# Patient Record
Sex: Female | Born: 1970 | Race: White | Hispanic: No | Marital: Married | Smoking: Never smoker
Health system: Southern US, Community
[De-identification: ages and names within clinical notes are randomized; demographics above are authoritative.]

## PROBLEM LIST (undated history)

## (undated) DIAGNOSIS — E785 Hyperlipidemia, unspecified: Secondary | ICD-10-CM

## (undated) DIAGNOSIS — K219 Gastro-esophageal reflux disease without esophagitis: Secondary | ICD-10-CM

## (undated) DIAGNOSIS — F419 Anxiety disorder, unspecified: Secondary | ICD-10-CM

## (undated) DIAGNOSIS — R011 Cardiac murmur, unspecified: Secondary | ICD-10-CM

## (undated) DIAGNOSIS — D649 Anemia, unspecified: Secondary | ICD-10-CM

## (undated) DIAGNOSIS — R7309 Other abnormal glucose: Secondary | ICD-10-CM

## (undated) HISTORY — DX: Gastro-esophageal reflux disease without esophagitis: K21.9

## (undated) HISTORY — DX: Other abnormal glucose: R73.09

## (undated) HISTORY — PX: HYSTEROSCOPY WITH D & C: SHX1775

## (undated) HISTORY — PX: DILATION AND EVACUATION: SHX1459

## (undated) HISTORY — DX: Hyperlipidemia, unspecified: E78.5

## (undated) HISTORY — DX: Cardiac murmur, unspecified: R01.1

## (undated) HISTORY — DX: Anemia, unspecified: D64.9

## (undated) HISTORY — DX: Anxiety disorder, unspecified: F41.9

---

## 2002-08-27 HISTORY — PX: BREAST BIOPSY: SHX20

## 2016-10-01 ENCOUNTER — Encounter: Payer: Self-pay | Admitting: Family Medicine

## 2016-10-01 ENCOUNTER — Ambulatory Visit (INDEPENDENT_AMBULATORY_CARE_PROVIDER_SITE_OTHER): Payer: 59 | Admitting: Family Medicine

## 2016-10-01 VITALS — BP 134/81 | HR 83 | Temp 98.6°F | Resp 16 | Ht 65.0 in | Wt 140.0 lb

## 2016-10-01 DIAGNOSIS — Z0000 Encounter for general adult medical examination without abnormal findings: Secondary | ICD-10-CM | POA: Diagnosis not present

## 2016-10-01 DIAGNOSIS — Z130 Encounter for screening for diseases of the blood and blood-forming organs and certain disorders involving the immune mechanism: Secondary | ICD-10-CM

## 2016-10-01 DIAGNOSIS — Z1329 Encounter for screening for other suspected endocrine disorder: Secondary | ICD-10-CM | POA: Diagnosis not present

## 2016-10-01 DIAGNOSIS — Z23 Encounter for immunization: Secondary | ICD-10-CM | POA: Diagnosis not present

## 2016-10-01 DIAGNOSIS — E785 Hyperlipidemia, unspecified: Secondary | ICD-10-CM

## 2016-10-01 DIAGNOSIS — Z1231 Encounter for screening mammogram for malignant neoplasm of breast: Secondary | ICD-10-CM | POA: Diagnosis not present

## 2016-10-01 DIAGNOSIS — R7309 Other abnormal glucose: Secondary | ICD-10-CM

## 2016-10-01 NOTE — Progress Notes (Signed)
Patient ID: Heather Shah, female  DOB: [birth year], 46 y.o.   MRN: [id number] Patient Care Team    Relationship Specialty Notifications Start End  Natalia Leatherwood, DO PCP - General Family Medicine  10/01/16   Romualdo Bolk, MD Consulting Physician Obstetrics and Gynecology  10/01/16     Subjective:  Heather Shah is a 46 y.o.  female present for new patient establishment/CPE. All past medical history, surgical history, allergies, family history, immunizations, medications and social history were obtained and entered  in the electronic medical record today. All recent labs, ED visits and hospitalizations within the last year were reviewed.  Prediabetic:Pt reports she had an a1c in May 2017 that was elevated to 5.8 (in [place]). At the time she was under a great deal of stress, house hunting and getting ready to relocate. She reports a healthy diet, but she has not been exercising as much as she would like.    Menorrhagia: Pt states she had an abnormal PAP within the last year and is due for repeat PAP. She also endorses heavier menstrual cycle, mild nausea with cycle and low back pain, increase in bloating. She is establishing with Dr. Dorita Sciara (GYN) in 1 week.   Health maintenance:  Colonoscopy: No fhx, screen at 48.  Mammogram: Last mammogram a few years ago, fibro-density of breast. She had to have a breast biopsy 2004, that was benign per her reports. She has a Mat sunt with breast cancer in 82s. Mammogram ordered at breast center.  Cervical cancer screening: last pap: 02/2016, results: abnormal cells, completed by: in [place] Immunizations: tdap admin today, Influenza declined (encouraged yearly) Infectious disease screening: HIV cpmpleted Assistive device: none Oxygen WUJ:WJXB Patient has a Dental home. Hospitalizations/ED visits: None  Immunization History  Administered Date(s) Administered  . Tdap 10/01/2016     Past Medical History:  Diagnosis Date  . Anemia   . Anxiety    . Elevated hemoglobin A1c   . Heart murmur   . Hyperlipidemia    Allergies  Allergen Reactions  . Bactrim [Sulfamethoxazole-Trimethoprim] Anaphylaxis    hallucinating  . Penicillins     sneezing   Past Surgical History:  Procedure Laterality Date  . BREAST BIOPSY  2004  . CESAREAN SECTION  2007  . DILATION AND EVACUATION  2010, 2012   x 2   Family History  Problem Relation Age of Onset  . Diabetes Mother   . Hypertension Mother   . Heart disease Mother   . Diabetes Father   . Alcohol abuse Father   . Hypertension Father   . Hyperlipidemia Father   . Heart disease Father   . Breast cancer Maternal Aunt 91   Social History   Social History  . Marital status: Married    Spouse name: [name]  . Number of children: 1  . Years of education: 4   Occupational History  . retirement Research scientist (medical)     Social History Main Topics  . Smoking status: Never Smoker  . Smokeless tobacco: Never Used  . Alcohol use 3.6 oz/week    6 Glasses of wine per week  . Drug use: No  . Sexual activity: Yes    Partners: Male     Comment: Married   Other Topics Concern  . Not on file   Social History Narrative   Married to "[name]". One child.    BA, Designer, fashion/clothing.    Moved from [facility] 02/2016.    Drinks caffeine. Uses herbal remedies.  Wears her seatbelt. Smoke detector in the home.    Firearms locked in the home.    Feels safe in her realtionships.    Allergies as of 10/01/2016      Reactions   Bactrim [sulfamethoxazole-trimethoprim] Anaphylaxis   hallucinating   Penicillins    sneezing      Medication List       Accurate as of 10/01/16 12:47 PM. Always use your most recent med list.          BIOTIN FORTE PO Take by mouth.   BLACK COHOSH PO Take by mouth.   IRON 27 PO Take by mouth.   Vitamin D (Cholecalciferol) 1000 units Caps Take by mouth.        No results found for this or any previous visit (from the past 2160 hour(s)).  Patient was never  admitted.   ROS: 14 pt review of systems performed and negative (unless mentioned in an HPI)  Objective: BP 134/81 (BP Location: Left Arm, Patient Position: Sitting, Cuff Size: Normal)   Pulse 83   Temp 98.6 F (37 C) (Oral)   Resp 16   Ht 5\' 5"  (1.651 m)   Wt 140 lb (63.5 kg)   LMP 09/28/2016 (Exact Date)   SpO2 99%   BMI 23.30 kg/m  Gen: Afebrile. No acute distress. Nontoxic in appearance, well-developed, well-nourished,  pleasant caucasian female.  HENT: AT. [place]. Bilateral TM visualized and normal in appearance, normal external auditory canal. MMM, no oral lesions, adequate dentition. Bilateral nares within normal limits. Throat without erythema, ulcerations or exudates. no Cough on exam, no hoarseness on exam. Eyes:Pupils Equal Round Reactive to light, Extraocular movements intact,  Conjunctiva without redness, discharge or icterus. Neck/lymp/endocrine: Supple,no lymphadenopathy, no thyromegaly CV: RRR 1/6 SM, no edema, +2/4 P posterior tibialis pulses. No  carotid bruits. No JVD. Chest: CTAB, no wheeze, rhonchi or crackles. normal Respiratory effort. good Air movement. Abd: Soft. flat. NTND. BS present. no Masses palpated. No hepatosplenomegaly. No rebound tenderness or guarding. Skin: no rashes, purpura or petechiae. Warm and well-perfused. Skin intact. Neuro/Msk:  Normal gait. PERLA. EOMi. Alert. Oriented x3.  Cranial nerves II through XII intact. Muscle strength 5/5 upper/lower extremity. DTRs equal bilaterally. Psych: Normal affect, dress and demeanor. Normal speech. Normal thought content and judgment.  Assessment/plan: Heather Shah is a 46 y.o. female present for establishment of care with CPE.  Encounter for preventive health examination Patient was encouraged to exercise greater than 150 minutes a week. Patient was encouraged to choose a diet filled with fresh fruits and vegetables, and lean meats. AVS provided to patient today for education/recommendation on gender  specific health and safety maintenance. Colonoscopy: No fhx, screen at 50.  Mammogram: Last mammogram a few years ago, fibro-density of breast. She had to have a breast biopsy 2004, that was benign per her reports. She has a Mat sunt with breast cancer in 3660s. Mammogram ordered at breast center.  Cervical cancer screening: last pap: 02/2016, results: abnormal cells, completed by: in [place] Immunizations: tdap admin today, Influenza declined (encouraged yearly) Infectious disease screening: HIV cpmpleted Assistive device: none Oxygen BJY:NWGNuse:none Patient has a Dental home. Hospitalizations/ED visits: None  Encounter for screening mammogram for breast cancer - MM Digital Diagnostic Bilat; Future Screening for deficiency anemia - CBC w/Diff; Future Thyroid disorder screen - Lipid panel; Future - TSH; Future Hyperlipidemia, unspecified hyperlipidemia type - Hemoglobin A1c; Future - Lipid panel; Future Elevated hemoglobin A1c - COMPLETE METABOLIC PANEL WITH GFR; Future - Hemoglobin A1c; Future  Return in about 1 year (around 10/01/2017) for CPE.  Electronically signed by: Felix Pacini, DO [facility] [place]

## 2016-10-01 NOTE — Patient Instructions (Signed)
Health Maintenance, Female Introduction Adopting a healthy lifestyle and getting preventive care can go a long way to promote health and wellness. Talk with your health care provider about what schedule of regular examinations is right for you. This is a good chance for you to check in with your provider about disease prevention and staying healthy. In between checkups, there are plenty of things you can do on your own. Experts have done a lot of research about which lifestyle changes and preventive measures are most likely to keep you healthy. Ask your health care provider for more information. Weight and diet Eat a healthy diet  Be sure to include plenty of vegetables, fruits, low-fat dairy products, and lean protein.  Do not eat a lot of foods high in solid fats, added sugars, or salt.  Get regular exercise. This is one of the most important things you can do for your health.  Most adults should exercise for at least 150 minutes each week. The exercise should increase your heart rate and make you sweat (moderate-intensity exercise).  Most adults should also do strengthening exercises at least twice a week. This is in addition to the moderate-intensity exercise. Maintain a healthy weight  Body mass index (BMI) is a measurement that can be used to identify possible weight problems. It estimates body fat based on height and weight. Your health care provider can help determine your BMI and help you achieve or maintain a healthy weight.  For females 41 years of age and older:  A BMI below 18.5 is considered underweight.  A BMI of 18.5 to 24.9 is normal.  A BMI of 25 to 29.9 is considered overweight.  A BMI of 30 and above is considered obese. Watch levels of cholesterol and blood lipids  You should start having your blood tested for lipids and cholesterol at 48 years of age, then have this test every 5 years.  You may need to have your cholesterol levels checked more often if:  Your  lipid or cholesterol levels are high.  You are older than 48 years of age.  You are at high risk for heart disease. Cancer screening Lung Cancer  Lung cancer screening is recommended for adults 66-37 years old who are at high risk for lung cancer because of a history of smoking.  A yearly low-dose CT scan of the lungs is recommended for people who:  Currently smoke.  Have quit within the past 15 years.  Have at least a 30-pack-year history of smoking. A pack year is smoking an average of one pack of cigarettes a day for 1 year.  Yearly screening should continue until it has been 15 years since you quit.  Yearly screening should stop if you develop a health problem that would prevent you from having lung cancer treatment. Breast Cancer  Practice breast self-awareness. This means understanding how your breasts normally appear and feel.  It also means doing regular breast self-exams. Let your health care provider know about any changes, no matter how small.  If you are in your 20s or 30s, you should have a clinical breast exam (CBE) by a health care provider every 1-3 years as part of a regular health exam.  If you are 45 or older, have a CBE every year. Also consider having a breast X-ray (mammogram) every year.  If you have a family history of breast cancer, talk to your health care provider about genetic screening.  If you are at high risk for breast cancer,  talk to your health care provider about having an MRI and a mammogram every year.  Breast cancer gene (BRCA) assessment is recommended for women who have family members with BRCA-related cancers. BRCA-related cancers include:  Breast.  Ovarian.  Tubal.  Peritoneal cancers.  Results of the assessment will determine the need for genetic counseling and BRCA1 and BRCA2 testing. Cervical Cancer  Your health care provider may recommend that you be screened regularly for cancer of the pelvic organs (ovaries, uterus, and  vagina). This screening involves a pelvic examination, including checking for microscopic changes to the surface of your cervix (Pap test). You may be encouraged to have this screening done every 3 years, beginning at age 21.  For women ages 30-65, health care providers may recommend pelvic exams and Pap testing every 3 years, or they may recommend the Pap and pelvic exam, combined with testing for human papilloma virus (HPV), every 5 years. Some types of HPV increase your risk of cervical cancer. Testing for HPV may also be done on women of any age with unclear Pap test results.  Other health care providers may not recommend any screening for nonpregnant women who are considered low risk for pelvic cancer and who do not have symptoms. Ask your health care provider if a screening pelvic exam is right for you.  If you have had past treatment for cervical cancer or a condition that could lead to cancer, you need Pap tests and screening for cancer for at least 20 years after your treatment. If Pap tests have been discontinued, your risk factors (such as having a new sexual partner) need to be reassessed to determine if screening should resume. Some women have medical problems that increase the chance of getting cervical cancer. In these cases, your health care provider may recommend more frequent screening and Pap tests. Colorectal Cancer  This type of cancer can be detected and often prevented.  Routine colorectal cancer screening usually begins at 48 years of age and continues through 48 years of age.  Your health care provider may recommend screening at an earlier age if you have risk factors for colon cancer.  Your health care provider may also recommend using home test kits to check for hidden blood in the stool.  A small camera at the end of a tube can be used to examine your colon directly (sigmoidoscopy or colonoscopy). This is done to check for the earliest forms of colorectal  cancer.  Routine screening usually begins at age 50.  Direct examination of the colon should be repeated every 5-10 years through 48 years of age. However, you may need to be screened more often if early forms of precancerous polyps or small growths are found. Skin Cancer  Check your skin from head to toe regularly.  Tell your health care provider about any new moles or changes in moles, especially if there is a change in a mole's shape or color.  Also tell your health care provider if you have a mole that is larger than the size of a pencil eraser.  Always use sunscreen. Apply sunscreen liberally and repeatedly throughout the day.  Protect yourself by wearing long sleeves, pants, a wide-brimmed hat, and sunglasses whenever you are outside. Heart disease, diabetes, and high blood pressure  High blood pressure causes heart disease and increases the risk of stroke. High blood pressure is more likely to develop in:  People who have blood pressure in the high end of the normal range (130-139/85-89 mm Hg).    People who are overweight or obese.  People who are African American.  If you are 18-39 years of age, have your blood pressure checked every 3-5 years. If you are 40 years of age or older, have your blood pressure checked every year. You should have your blood pressure measured twice-once when you are at a hospital or clinic, and once when you are not at a hospital or clinic. Record the average of the two measurements. To check your blood pressure when you are not at a hospital or clinic, you can use:  An automated blood pressure machine at a pharmacy.  A home blood pressure monitor.  If you are between 55 years and 79 years old, ask your health care provider if you should take aspirin to prevent strokes.  Have regular diabetes screenings. This involves taking a blood sample to check your fasting blood sugar level.  If you are at a normal weight and have a low risk for diabetes,  have this test once every three years after 48 years of age.  If you are overweight and have a high risk for diabetes, consider being tested at a younger age or more often. Preventing infection Hepatitis B  If you have a higher risk for hepatitis B, you should be screened for this virus. You are considered at high risk for hepatitis B if:  You were born in a country where hepatitis B is common. Ask your health care provider which countries are considered high risk.  Your parents were born in a high-risk country, and you have not been immunized against hepatitis B (hepatitis B vaccine).  You have HIV or AIDS.  You use needles to inject street drugs.  You live with someone who has hepatitis B.  You have had sex with someone who has hepatitis B.  You get hemodialysis treatment.  You take certain medicines for conditions, including cancer, organ transplantation, and autoimmune conditions. Hepatitis C  Blood testing is recommended for:  Everyone born from 1945 through 1965.  Anyone with known risk factors for hepatitis C. Sexually transmitted infections (STIs)  You should be screened for sexually transmitted infections (STIs) including gonorrhea and chlamydia if:  You are sexually active and are younger than 48 years of age.  You are older than 48 years of age and your health care provider tells you that you are at risk for this type of infection.  Your sexual activity has changed since you were last screened and you are at an increased risk for chlamydia or gonorrhea. Ask your health care provider if you are at risk.  If you do not have HIV, but are at risk, it may be recommended that you take a prescription medicine daily to prevent HIV infection. This is called pre-exposure prophylaxis (PrEP). You are considered at risk if:  You are sexually active and do not regularly use condoms or know the HIV status of your partner(s).  You take drugs by injection.  You are sexually  active with a partner who has HIV. Talk with your health care provider about whether you are at high risk of being infected with HIV. If you choose to begin PrEP, you should first be tested for HIV. You should then be tested every 3 months for as long as you are taking PrEP. Pregnancy  If you are premenopausal and you may become pregnant, ask your health care provider about preconception counseling.  If you may become pregnant, take 400 to 800 micrograms (mcg) of folic acid   every day.  If you want to prevent pregnancy, talk to your health care provider about birth control (contraception). Osteoporosis and menopause  Osteoporosis is a disease in which the bones lose minerals and strength with aging. This can result in serious bone fractures. Your risk for osteoporosis can be identified using a bone density scan.  If you are 65 years of age or older, or if you are at risk for osteoporosis and fractures, ask your health care provider if you should be screened.  Ask your health care provider whether you should take a calcium or vitamin D supplement to lower your risk for osteoporosis.  Menopause may have certain physical symptoms and risks.  Hormone replacement therapy may reduce some of these symptoms and risks. Talk to your health care provider about whether hormone replacement therapy is right for you. Follow these instructions at home:  Schedule regular health, dental, and eye exams.  Stay current with your immunizations.  Do not use any tobacco products including cigarettes, chewing tobacco, or electronic cigarettes.  If you are pregnant, do not drink alcohol.  If you are breastfeeding, limit how much and how often you drink alcohol.  Limit alcohol intake to no more than 1 drink per day for nonpregnant women. One drink equals 12 ounces of beer, 5 ounces of wine, or 1 ounces of hard liquor.  Do not use street drugs.  Do not share needles.  Ask your health care provider for  help if you need support or information about quitting drugs.  Tell your health care provider if you often feel depressed.  Tell your health care provider if you have ever been abused or do not feel safe at home. This information is not intended to replace advice given to you by your health care provider. Make sure you discuss any questions you have with your health care provider. Document Released: 02/26/2011 Document Revised: 01/19/2016 Document Reviewed: 05/17/2015  2017 [employer]  Please help us help you:  We are honored you have chosen [facility] for your Primary Care home. Below you will find basic instructions that you may need to access in the future. Please help us help you by reading the instructions, which cover many of the frequent questions we experience.   Prescription refills and request:  -In order to allow more efficient response time, please call your pharmacy for all refills. They will forward the request electronically to us. This allows for the quickest possible response. Request left on a nurse line can take longer to refill, since these are checked as time allows between office patients and other phone calls.  - refill request can take up to 3-5 working days to complete.  - If request is sent electronically and request is appropiate, it is usually completed in 1-2 business days.  - all patients will need to be seen routinely for all chronic medical conditions requiring prescription medications (see follow-up below). If you are overdue for follow up on your condition, you will be asked to make an appointment and we will call in enough medication to cover you until your appointment (up to 30 days).  - all controlled substances will require a face to face visit to request/refill.  - if you desire your prescriptions to go through a new pharmacy, and have an active script at original pharmacy, you will need to call your pharmacy and have scripts transferred to new pharmacy.  This is completed between the pharmacy locations and not by your provider.      Results: If any images or labs were ordered, it can take up to 1 week to get results depending on the test ordered and the lab/facility running and resulting the test. - Normal or stable results, which do not need further discussion, will be released to your mychart immediately with attached note to you. A call will not be generated for normal results. Please make certain to sign up for mychart. If you have questions on how to activate your mychart you can call the front office.  - If your results need further discussion, our office will attempt to contact you via phone, and if unable to reach you after 2 attempts, we will release your abnormal result to your mychart with instructions.  - All results will be automatically released in mychart after 1 week.  - Your provider will provide you with explanation and instruction on all relevant material in your results. Please keep in mind, results and labs may appear confusing or abnormal to the untrained eye, but it does not mean they are actually abnormal for you personally. If you have any questions about your results that are not covered, or you desire more detailed explanation than what was provided, you should make an appointment with your provider to do so.   Our office handles many outgoing and incoming calls daily. If we have not contacted you within 1 week about your results, please check your mychart to see if there is a message first and if not, then contact our office.  In helping with this matter, you help decrease call volume, and therefore allow [place] to be able to respond to patients needs more efficiently.   Acute office visits (sick visit):  An acute visit is intended for a new problem and are scheduled in shorter time slots to allow schedule openings for patients with new problems. This is the appropriate visit to discuss a new problem. In order to provide you with  excellent quality medical care with proper time for you to explain your problem, have an exam and receive treatment with instructions, these appointments should be limited to one new problem per visit. If you experience a new problem, in which you desire to be addressed, please make an acute office visit, we save openings on the schedule to accommodate you. Please do not save your new problem for any other type of visit, let us take care of it properly and quickly for you.   Follow up visits:  Depending on your condition(s) your provider will need to see you routinely in order to provide you with quality care and prescribe medication(s). Most chronic conditions (Example: hypertension, Diabetes, depression/anxiety... etc), require visits a couple times a year. Your provider will instruct you on proper follow up for your personal medical conditions and history. Please make certain to make follow up appointments for your condition as instructed. Failing to do so could result in lapse in your medication treatment/refills. If you request a refill, and are overdue to be seen on a condition, we will always provide you with a 30 day script (once) to allow you time to schedule.    Medicare wellness (well visit): - we have a wonderful Nurse Maudie Mercury), that will meet with you and provide you will yearly medicare wellness visits. These visits should occur yearly (can not be scheduled less than 1 calendar year apart) and cover preventive health, immunizations, advance directives and screenings you are entitled to yearly through your medicare benefits. Do not miss out on your entitled  benefits, this is when medicare will pay for these benefits to be ordered for you.  These are strongly encouraged by your provider and is the appropriate type of visit to make certain you are up to date with all preventive health benefits. If you have not had your medicare wellness exam in the last 12 months, please make certain to schedule one  by calling the office and schedule your medicare wellness with Kim as soon as possible.   Yearly physical (well visit):  - Adults are recommended to be seen yearly for physicals. Check with your insurance and date of your last physical, most insurances require one calendar year between physicals. Physicals include all preventive health topics, screenings, medical exam and labs that are appropriate for gender/age and history. You may have fasting labs needed at this visit. This is a well visit (not a sick visit), acute topics should not be covered during this visit.  - Pediatric patients are seen more frequently when they are younger. Your provider will advise you on well child visit timing that is appropriate for your their age. - This is not a medicare wellness visit. Medicare wellness exams do not have an exam portion to the visit. Some medicare companies allow for a physical, some do not allow a yearly physical. If your medicare allows a yearly physical you can schedule the medicare wellness with our nurse Kim and have your physical with your provider after, on the same day. Please check with insurance for your full benefits.   Late Policy/No Shows:  - all new patients should arrive 15-30 minutes earlier than appointment to allow us time  to  obtain all personal demographics,  insurance information and for you to complete office paperwork. - All established patients should arrive 10-15 minutes earlier than appointment time to update all information and be checked in .  - In our best efforts to run on time, if you are late for your appointment you will be asked to either reschedule or if able, we will work you back into the schedule. There will be a wait time to work you back in the schedule,  depending on availability.  - If you are unable to make it to your appointment as scheduled, please call 24 hours ahead of time to allow us to fill the time slot with someone else who needs to be seen. If you do  not cancel your appointment ahead of time, you may be charged a no show fee.   

## 2016-10-02 ENCOUNTER — Other Ambulatory Visit: Payer: 59

## 2016-10-04 ENCOUNTER — Telehealth: Payer: Self-pay | Admitting: Family Medicine

## 2016-10-04 NOTE — Telephone Encounter (Signed)
LM for patient to sign record release for mammogram disk if she has had one in the past so that [facility] can use it for comparison. She has a lab appointment Monday here in our office.

## 2016-10-08 ENCOUNTER — Telehealth: Payer: Self-pay | Admitting: Family Medicine

## 2016-10-08 ENCOUNTER — Other Ambulatory Visit (INDEPENDENT_AMBULATORY_CARE_PROVIDER_SITE_OTHER): Payer: 59

## 2016-10-08 DIAGNOSIS — E785 Hyperlipidemia, unspecified: Secondary | ICD-10-CM

## 2016-10-08 DIAGNOSIS — Z1329 Encounter for screening for other suspected endocrine disorder: Secondary | ICD-10-CM

## 2016-10-08 DIAGNOSIS — R7309 Other abnormal glucose: Secondary | ICD-10-CM

## 2016-10-08 DIAGNOSIS — Z130 Encounter for screening for diseases of the blood and blood-forming organs and certain disorders involving the immune mechanism: Secondary | ICD-10-CM | POA: Diagnosis not present

## 2016-10-08 LAB — LIPID PANEL
CHOL/HDL RATIO: 4.6 ratio (ref <5.0)
CHOLESTEROL: 148 mg/dL (ref <200)
HDL: 32 mg/dL — AB (ref >50)
LDL Cholesterol: 104 mg/dL — ABNORMAL HIGH (ref <100)
TRIGLYCERIDES: 62 mg/dL (ref <150)
VLDL: 12 mg/dL (ref <30)

## 2016-10-08 LAB — HEMOGLOBIN A1C
Hgb A1c MFr Bld: 5.1 % (ref <5.7)
Mean Plasma Glucose: 100 mg/dL

## 2016-10-08 LAB — COMPLETE METABOLIC PANEL WITH GFR
ALK PHOS: 48 U/L (ref 33–115)
ALT: 17 U/L (ref 6–29)
AST: 16 U/L (ref 10–35)
Albumin: 4 g/dL (ref 3.6–5.1)
BUN: 8 mg/dL (ref 7–25)
CHLORIDE: 105 mmol/L (ref 98–110)
CO2: 27 mmol/L (ref 20–31)
Calcium: 9.2 mg/dL (ref 8.6–10.2)
Creat: 0.83 mg/dL (ref 0.50–1.10)
GFR, EST NON AFRICAN AMERICAN: 84 mL/min (ref >=60)
GFR, Est African American: >89 mL/min (ref >=60)
GLUCOSE: 93 mg/dL (ref 65–99)
POTASSIUM: 4.7 mmol/L (ref 3.5–5.3)
SODIUM: 139 mmol/L (ref 135–146)
Total Bilirubin: 0.2 mg/dL (ref 0.2–1.2)
Total Protein: 6.5 g/dL (ref 6.1–8.1)

## 2016-10-08 LAB — TSH: TSH: 2.63 mIU/L

## 2016-10-08 NOTE — Telephone Encounter (Signed)
Opened in error

## 2016-10-08 NOTE — Telephone Encounter (Signed)
Patient has signed release for mammogram & I have faxed it.

## 2016-10-09 ENCOUNTER — Telehealth: Payer: Self-pay | Admitting: Family Medicine

## 2016-10-09 LAB — CBC WITH DIFFERENTIAL/PLATELET
BASOS ABS: 59 {cells}/uL (ref 0–200)
Basophils Relative: 1 %
EOS PCT: 2 %
Eosinophils Absolute: 118 cells/uL (ref 15–500)
HCT: 39.7 % (ref 35.0–45.0)
Hemoglobin: 12.8 g/dL (ref 11.7–15.5)
Lymphocytes Relative: 34 %
Lymphs Abs: 2006 cells/uL (ref 850–3900)
MCH: 29.9 pg (ref 27.0–33.0)
MCHC: 32.2 g/dL (ref 32.0–36.0)
MCV: 92.8 fL (ref 80.0–100.0)
MONOS PCT: 9 %
MPV: 9.7 fL (ref 7.5–12.5)
Monocytes Absolute: 531 cells/uL (ref 200–950)
NEUTROS PCT: 54 %
Neutro Abs: 3186 cells/uL (ref 1500–7800)
PLATELETS: 357 10*3/uL (ref 140–400)
RBC: 4.28 MIL/uL (ref 3.80–5.10)
RDW: 13.9 % (ref 11.0–15.0)
WBC: 5.9 10*3/uL (ref 3.8–10.8)

## 2016-10-09 NOTE — Telephone Encounter (Signed)
Please call pt - all labs are stable/normal.

## 2016-10-09 NOTE — Telephone Encounter (Signed)
Detailed message left on voice mail. Okay per DPR. 

## 2016-10-10 ENCOUNTER — Ambulatory Visit (INDEPENDENT_AMBULATORY_CARE_PROVIDER_SITE_OTHER): Payer: 59 | Admitting: Obstetrics and Gynecology

## 2016-10-10 ENCOUNTER — Encounter: Payer: Self-pay | Admitting: Obstetrics and Gynecology

## 2016-10-10 VITALS — BP 110/70 | HR 76 | Resp 16 | Ht 65.0 in | Wt 137.0 lb

## 2016-10-10 DIAGNOSIS — Z87898 Personal history of other specified conditions: Secondary | ICD-10-CM

## 2016-10-10 DIAGNOSIS — N898 Other specified noninflammatory disorders of vagina: Secondary | ICD-10-CM

## 2016-10-10 DIAGNOSIS — N920 Excessive and frequent menstruation with regular cycle: Secondary | ICD-10-CM | POA: Diagnosis not present

## 2016-10-10 DIAGNOSIS — Z8742 Personal history of other diseases of the female genital tract: Secondary | ICD-10-CM

## 2016-10-10 NOTE — Patient Instructions (Signed)
Menorrhagia °Menorrhagia is the medical term for when your menstrual periods are heavy or last longer than usual. With menorrhagia, every period you have may cause enough blood loss and cramping that you are unable to maintain your usual activities. °CAUSES  °In some cases, the cause of heavy periods is unknown, but a number of conditions may cause menorrhagia. Common causes include: °· A problem with the hormone-producing thyroid gland (hypothyroid). °· Noncancerous growths in the uterus (polyps or fibroids). °· An imbalance of the estrogen and progesterone hormones. °· One of your ovaries not releasing an egg during one or more months. °· Side effects of having an intrauterine device (IUD). °· Side effects of some medicines, such as anti-inflammatory medicines or blood thinners. °· A bleeding disorder that stops your blood from clotting normally. °SIGNS AND SYMPTOMS  °During a normal period, bleeding lasts between 4 and 8 days. Signs that your periods are too heavy include: °· You routinely have to change your pad or tampon every 1 or 2 hours because it is completely soaked. °· You pass blood clots larger than 1 inch (2.5 cm) in size. °· You have bleeding for more than 7 days. °· You need to use pads and tampons at the same time because of heavy bleeding. °· You need to wake up to change your pads or tampons during the night. °· You have symptoms of anemia, such as tiredness, fatigue, or shortness of breath.  °DIAGNOSIS  °Your health care provider will perform a physical exam and ask you questions about your symptoms and menstrual history. Other tests may be ordered based on what the health care provider finds during the exam. These tests can include: °· Blood tests. Blood tests are used to check if you are pregnant or have hormonal changes, a bleeding or thyroid disorder, low iron levels (anemia), or other problems. °· Endometrial biopsy. Your health care provider takes a sample of tissue from the inside of your  uterus to be examined under a microscope. °· Pelvic ultrasound. This test uses sound waves to make a picture of your uterus, ovaries, and vagina. The pictures can show if you have fibroids or other growths. °· Hysteroscopy. For this test, your health care provider will use a small telescope to look inside your uterus. °Based on the results of your initial tests, your health care provider may recommend further testing. °TREATMENT  °Treatment may not be needed. If it is needed, your health care provider may recommend treatment with one or more medicines first. If these do not reduce bleeding enough, a surgical treatment might be an option. The best treatment for you will depend on:  °· Whether you need to prevent pregnancy.   °· Your desire to have children in the future. °· The cause and severity of your bleeding. °· Your opinion and personal preference.   °Medicines for menorrhagia may include: °· Birth control methods that use hormones. These include the pill, skin patch, vaginal ring, shots that you get every 3 months, hormonal IUD, and implant. These treatments reduce bleeding during your menstrual period. °· Medicines that thicken blood and slow bleeding. °· Medicines that reduce swelling, such as ibuprofen.  °· Medicines that contain a synthetic hormone called progestin.   °· Medicines that make the ovaries stop working for a short time.   °You may need surgical treatment for menorrhagia if the medicines are unsuccessful. Treatment options include: °· Dilation and curettage (D&C). In this procedure, your health care provider opens (dilates) your cervix and then scrapes or suctions tissue from   the lining of your uterus to reduce menstrual bleeding. °· Operative hysteroscopy. This procedure uses a tiny tube with a light (hysteroscope) to view your uterine cavity and can help in the surgical removal of a polyp that may be causing heavy periods. °· Endometrial ablation. Through various techniques, your health care  provider permanently destroys the entire lining of your uterus (endometrium). After endometrial ablation, most women have little or no menstrual flow. Endometrial ablation reduces your ability to become pregnant. °· Endometrial resection. This surgical procedure uses an electrosurgical wire loop to remove the lining of the uterus. This procedure also reduces your ability to become pregnant. °· Hysterectomy. Surgical removal of the uterus and cervix is a permanent procedure that stops menstrual periods. Pregnancy is not possible after a hysterectomy. This procedure requires anesthesia and hospitalization. °HOME CARE INSTRUCTIONS  °· Only take over-the-counter or prescription medicines as directed by your health care provider. Take prescribed medicines exactly as directed. Do not change or switch medicines without consulting your health care provider. °· Take any prescribed iron pills exactly as directed by your health care provider. Long-term heavy bleeding may result in low iron levels. Iron pills help replace the iron your body lost from heavy bleeding. Iron may cause constipation. If this becomes a problem, increase the bran, fruits, and roughage in your diet. °· Do not take aspirin or medicines that contain aspirin 1 week before or during your menstrual period. Aspirin may make the bleeding worse. °· If you need to change your sanitary pad or tampon more than once every 2 hours, stay in bed and rest as much as possible until the bleeding stops. °· Eat well-balanced meals. Eat foods high in iron. Examples are leafy green vegetables, meat, liver, eggs, and whole grain breads and cereals. Do not try to lose weight until the abnormal bleeding has stopped and your blood iron level is back to normal. °SEEK MEDICAL CARE IF:  °· You soak through a pad or tampon every 1 or 2 hours, and this happens every time you have a period. °· You need to use pads and tampons at the same time because you are bleeding so much. °· You  need to change your pad or tampon during the night. °· You have a period that lasts for more than 8 days. °· You pass clots bigger than 1 inch wide. °· You have irregular periods that happen more or less often than once a month. °· You feel dizzy or faint. °· You feel very weak or tired. °· You feel short of breath or feel your heart is beating too fast when you exercise. °· You have nausea and vomiting or diarrhea while you are taking your medicine. °· You have any problems that may be related to the medicine you are taking. °SEEK IMMEDIATE MEDICAL CARE IF:  °· You soak through 4 or more pads or tampons in 2 hours. °· You have any bleeding while you are pregnant. °MAKE SURE YOU:  °· Understand these instructions. °· Will watch your condition. °· Will get help right away if you are not doing well or get worse. °This information is not intended to replace advice given to you by your health care provider. Make sure you discuss any questions you have with your health care provider. °Document Released: 08/13/2005 Document Revised: 12/05/2015 Document Reviewed: 02/01/2013 °Elsevier Interactive Patient Education © 2017 Elsevier Inc. ° °

## 2016-10-10 NOTE — Progress Notes (Signed)
46 y.o. [id number] MarriedCaucasianF here for follow up of an abnormal pap and heavy bleeding.   She has a h/o endometrial polyps, S/P D&C in 10/16. Other than a thickened stripe, she had a normal gyn ultrasound in 7/16.  Menses q 28-30 days x 5-7 days. Saturates a pad in up to 30 minutes at times. Not all of her cycles are that heavy. Her bleeding got lighter after her surgery, then it got heavy again.  Her heavy cycles started in around 2013.  Her last cycle was so heavy, she didn't feel well. Her Hgb from 10/08/16 was normal.  No cramps.  Sexually active, using W/D for contraception. Doesn't want to get pregnant. She would like her husband to get a vasectomy. She was on OCP's in the past, stopped when she found a lump in her breast.  She is having mild perimenopausal symptoms. Mild vasomotor symptoms, mood changes. She had a LSIL pap in 6/17, had a satisfactory colposcopy with a negative ECC. She does c/o a vaginal odor, not sure if it is sweat or not. Going on for a while.     Patient's last menstrual period was 09/28/2016 (exact date).          Sexually active: Yes.    The current method of family planning is none.    Exercising: No.  The patient does not participate in regular exercise at present. Smoker:  no  Health Maintenance: Pap:  02/20/16-LGSIL, Mild Dysplasia  History of abnormal Pap:  Yes-03/07/16 Colpo-wnl, satisfactory, negative ECC  Prior to last year she had an abnormal pap with normal f/u.  MMG:  04/09/2012 BIRADS2,  Colonoscopy:  Never BMD:   Never TDaP:  10/01/16 Gardasil: Never   reports that she has never smoked. She has never used smokeless tobacco. She reports that she drinks about 3.6 oz of alcohol per week . She reports that she does not use drugs.She works in [employer] sales, travels for work.  She moved from [place], doing better here, much less stressful. Daughter is 10 almost 7111. Transitioning okay.   Past Medical History:  Diagnosis Date  . Anemia   .  Anxiety   . Elevated hemoglobin A1c   . Heart murmur   . Hyperlipidemia   Her HgbA1 C and lipids are better in the last 6 months. She feels it is due to her decreased stress level.  Past Surgical History:  Procedure Laterality Date  . BREAST BIOPSY  2004  . CESAREAN SECTION  2007  . DILATION AND EVACUATION  2010, 2012   x 2    Current Outpatient Prescriptions  Medication Sig Dispense Refill  . BIOTIN FORTE PO Take by mouth.    Marland Kitchen. BLACK COHOSH PO Take by mouth.    . Ferrous Gluconate (IRON 27 PO) Take by mouth.    . Saccharomyces boulardii (PROBIOTIC) 250 MG CAPS Take by mouth.    . Vitamin D, Cholecalciferol, 1000 units CAPS Take by mouth.     No current facility-administered medications for this visit.     Family History  Problem Relation Age of Onset  . Diabetes Mother   . Hypertension Mother   . Heart disease Mother   . Diabetes Father   . Alcohol abuse Father   . Hypertension Father   . Hyperlipidemia Father   . Heart disease Father   . Breast cancer Maternal Aunt [id number]  Father died in March 2017, tons of stress in the last year, better now.  Review of Systems  Constitutional: Negative.   HENT: Negative.   Eyes: Negative.   Respiratory: Negative.   Cardiovascular: Negative.   Gastrointestinal: Negative.   Endocrine: Negative.   Genitourinary:       Pt c/o of vaginal odor  Musculoskeletal: Negative.   Skin: Negative.   Allergic/Immunologic: Negative.   Neurological: Negative.   Hematological: Negative.   Psychiatric/Behavioral: Negative.     Exam:   BP 110/70 (BP Location: Right Arm, Patient Position: Sitting, Cuff Size: Normal)   Pulse 76   Resp 16   Ht 5\' 5"  (1.651 m)   Wt 137 lb (62.1 kg)   LMP 09/28/2016 (Exact Date)   BMI 22.80 kg/m   Weight change: @WEIGHTCHANGE @ Height:   Height: 5\' 5"  (165.1 cm)  Ht Readings from Last 3 Encounters:  10/10/16 5\' 5"  (1.651 m)  10/01/16 5\' 5"  (1.651 m)    General appearance: alert, cooperative and appears  stated age Head: Normocephalic, without obvious abnormality, atraumatic Neck: no adenopathy, supple, symmetrical, trachea midline and thyroid normal to inspection and palpation Abdomen: soft, non-tender; bowel sounds normal; no masses,  no organomegaly Extremities: extremities normal, atraumatic, no cyanosis or edema Skin: Skin color, texture, turgor normal. No rashes or lesions Lymph nodes: Cervical nodes normal.    Pelvic: External genitalia:  no lesions              Urethra:  normal appearing urethra with no masses, tenderness or lesions              Bartholins and Skenes: normal                 Vagina: normal appearing vagina with normal color and discharge, no lesions              Cervix: no lesions               Bimanual Exam:  Uterus:  normal size, contour, position, consistency, mobility, non-tender and anteverted              Adnexa: no mass, fullness, tenderness               Rectovaginal: Confirms               Anus:  normal sphincter tone, no lesions  Chaperone was present for exam.  A:  H/O menorrhagia, not anemia, h/o endometrial polyp in the past  H/O LSIL pap in 6/17, negative colposcopy in 7/17. I reviewed the guidelines for f/u with LSIL pap  Vaginal odor  P:   Return for an ultrasound, possible sonohysterogram  Discussed mirena IUD  Due for a pap in 6/18  Wet prep probe  30 minutes spent face to face with the patient, over 50% in counseling

## 2016-10-11 LAB — WET PREP BY MOLECULAR PROBE
Candida species: NEGATIVE
GARDNERELLA VAGINALIS: NEGATIVE
Trichomonas vaginosis: NEGATIVE

## 2016-10-15 ENCOUNTER — Telehealth: Payer: Self-pay | Admitting: Family Medicine

## 2016-10-15 NOTE — Telephone Encounter (Signed)
LM for patient that Mammogram disk is available for pu at the front desk.

## 2016-10-16 ENCOUNTER — Telehealth: Payer: Self-pay | Admitting: Obstetrics and Gynecology

## 2016-10-16 NOTE — Telephone Encounter (Signed)
Called patient to review benefits for a recommended procedure. Left Voicemail requesting a call back. °

## 2016-10-17 NOTE — Telephone Encounter (Signed)
Spoke with patient to provide benefit information regarding a sonohysterogram. Patient understood the information presented, however is not sure she wants to proceed with this test.  Patient is asking if an IUD would be an option to consider? Advised patient I will forward her question to her doctor. Patient is agreeable to a return call.   Routing to Dr Oscar LaJertson for review

## 2016-10-18 NOTE — Telephone Encounter (Signed)
Left message to call [employer] at [phone]. 

## 2016-10-18 NOTE — Telephone Encounter (Signed)
Spoke with patient, advised as seen below per Dr. Oscar LaJertson. Patient expressed frustration. Patient states she does not have $1200 to pay for something she just had done a year ago. Patient states polyps get removed and grow back. Patient states she would like to weigh her options and just wait and see what happens. Recommended OV to discuss options and concerns, patient declined. Patient states she has a AEX in 6 months, will discuss then. Advised patient would update Dr. Oscar LaJertson and return call with any additional recommendations, patient is agreeable.  Dr. Oscar LaJertson, routing FYI, review and advise.

## 2016-10-18 NOTE — Telephone Encounter (Signed)
I'm concerned she may have developed another polyp, or something else may be going on. I would recommend she be evaluated. I wouldn't want to place an IUD without knowing if she has a medical reason for the bleeding.  She could do a trial of OCP's if she wants for 3 months to see if that controls the bleeding. I would want her to have a mammogram first though.  Please discuss with her.

## 2016-10-22 NOTE — Telephone Encounter (Signed)
Spoke with patient, advised as seen below per Dr. Oscar LaJertson. Patient states menses d/t start in next couple of days, will know then. Patient will wait until next AEX to f/u, will call if bleeding is worse. Patient thankful for return call and verbalizes understanding.  Routing to provider for final review. Patient is agreeable to disposition. Will close encounter.

## 2016-10-22 NOTE — Telephone Encounter (Signed)
I understand her frustration. Using the mirena IUD could stop further growth of polyps, but you wouldn't want to put an IUD in if she has a polyp in now.  I agree with calendaring her cycles and f/u at her annual. Call if bleeding gets worse, or with any other concerns.

## 2016-11-06 ENCOUNTER — Ambulatory Visit (INDEPENDENT_AMBULATORY_CARE_PROVIDER_SITE_OTHER): Payer: 59 | Admitting: Family Medicine

## 2016-11-06 ENCOUNTER — Encounter: Payer: Self-pay | Admitting: Family Medicine

## 2016-11-06 VITALS — BP 112/61 | HR 83 | Temp 98.4°F | Resp 20 | Wt 137.5 lb

## 2016-11-06 DIAGNOSIS — J40 Bronchitis, not specified as acute or chronic: Secondary | ICD-10-CM

## 2016-11-06 MED ORDER — METRONIDAZOLE 500 MG PO TABS: 500.0000 mg | ORAL_TABLET | Freq: Two times a day (BID) | ORAL | 0 refills | Status: DC

## 2016-11-06 MED ORDER — HYDROCODONE-HOMATROPINE 5-1.5 MG/5ML PO SYRP: 5.0000 mL | ORAL_SOLUTION | Freq: Three times a day (TID) | ORAL | 0 refills | Status: DC | PRN

## 2016-11-06 MED ORDER — DOXYCYCLINE HYCLATE 100 MG PO TABS: 100.0000 mg | ORAL_TABLET | Freq: Two times a day (BID) | ORAL | 0 refills | Status: DC

## 2016-11-06 NOTE — Patient Instructions (Addendum)
Rest, hydrate.  Daily zyrtec, flonase. Mucinex DM Start doxycyline every 12 hours for 10 days.  Cough syrup for night time use.    Acute Bronchitis, Adult Acute bronchitis is when air tubes (bronchi) in the lungs suddenly get swollen. The condition can make it hard to breathe. It can also cause these symptoms:  A cough.  Coughing up clear, yellow, or green mucus.  Wheezing.  Chest congestion.  Shortness of breath.  A fever.  Body aches.  Chills.  A sore throat. Follow these instructions at home: Medicines   Take over-the-counter and prescription medicines only as told by your doctor.  If you were prescribed an antibiotic medicine, take it as told by your doctor. Do not stop taking the antibiotic even if you start to feel better. General instructions   Rest.  Drink enough fluids to keep your pee (urine) clear or pale yellow.  Avoid smoking and secondhand smoke. If you smoke and you need help quitting, ask your doctor. Quitting will help your lungs heal faster.  Use an inhaler, cool mist vaporizer, or humidifier as told by your doctor.  Keep all follow-up visits as told by your doctor. This is important. How is this prevented? To lower your risk of getting this condition again:  Wash your hands often with soap and water. If you cannot use soap and water, use hand sanitizer.  Avoid contact with people who have cold symptoms.  Try not to touch your hands to your mouth, nose, or eyes.  Make sure to get the flu shot every year. Contact a doctor if:  Your symptoms do not get better in 2 weeks. Get help right away if:  You cough up blood.  You have chest pain.  You have very bad shortness of breath.  You become dehydrated.  You faint (pass out) or keep feeling like you are going to pass out.  You keep throwing up (vomiting).  You have a very bad headache.  Your fever or chills gets worse. This information is not intended to replace advice given to you  by your health care provider. Make sure you discuss any questions you have with your health care provider. Document Released: 01/30/2008 Document Revised: 03/21/2016 Document Reviewed: 02/01/2016 Elsevier Interactive Patient Education  2017 [employer]

## 2016-11-06 NOTE — Progress Notes (Signed)
Heather Shah , 05-24-1971, 46 y.o., female MRN: [id number] Patient Care Team    Relationship Specialty Notifications Start End  Heather Leatherwoodenee A Kuneff, DO PCP - General Family Medicine  10/01/16   Heather BolkJill Evelyn Jertson, MD Consulting Physician Obstetrics and Gynecology  10/01/16     CC: URI Subjective: Pt presents for an OV with complaints of URI of 5 days  duration.  Associated symptoms include cough, rhinorrhea, headache, ear fullness and increased phlegm production. She tried to take tessalon perles, antihistamine and OTC sinus medications. Nothing is helping and she can not sleep. She states she kept her husband up last night with her coughing.   No flowsheet data found.  Allergies  Allergen Reactions  . Bactrim [Sulfamethoxazole-Trimethoprim] Anaphylaxis    hallucinating  . Penicillins     sneezing   Social History  Substance Use Topics  . Smoking status: Never Smoker  . Smokeless tobacco: Never Used  . Alcohol use 3.6 oz/week    6 Glasses of wine per week   Past Medical History:  Diagnosis Date  . Anemia   . Anxiety   . Elevated hemoglobin A1c   . Heart murmur   . Hyperlipidemia    Past Surgical History:  Procedure Laterality Date  . BREAST BIOPSY  2004  . CESAREAN SECTION  2007  . DILATION AND EVACUATION  2010, 2012   x 2  . HYSTEROSCOPY W/D&C     Family History  Problem Relation Age of Onset  . Diabetes Mother   . Hypertension Mother   . Heart disease Mother   . Diabetes Father   . Alcohol abuse Father   . Hypertension Father   . Hyperlipidemia Father   . Heart disease Father   . Breast cancer Maternal Aunt 62  . Diabetes Paternal Aunt    Allergies as of 11/06/2016      Reactions   Bactrim [sulfamethoxazole-trimethoprim] Anaphylaxis   hallucinating   Penicillins    sneezing      Medication List       Accurate as of 11/06/16  2:31 PM. Always use your most recent med list.          BIOTIN FORTE PO Take by mouth.   BLACK COHOSH PO Take by mouth.   IRON 27 PO Take by mouth.   Probiotic 250 MG Caps Take by mouth.   Vitamin D (Cholecalciferol) 1000 units Caps Take by mouth.       No results found for this or any previous visit (from the past 24 hour(s)). No results found.   ROS: Negative, with the exception of above mentioned in HPI   Objective:  BP 112/61 (BP Location: Right Arm, Patient Position: Sitting, Cuff Size: Normal)   Pulse 83   Temp 98.4 F (36.9 C)   Resp 20   Wt 137 lb 8 oz (62.4 kg)   SpO2 100%   BMI 22.88 kg/m  Body mass index is 22.88 kg/m. Gen: Afebrile. No acute distress. Nontoxic in appearance, well developed, well nourished.  HENT: AT. [place]. Bilateral TM visualized fulness. MMM, no oral lesions. Bilateral nares WNL. Throat without erythema or exudates. PND, hoarseness and cough present.  Eyes:Pupils Equal Round Reactive to light, Extraocular movements intact,  Conjunctiva without redness, discharge or icterus. Neck/lymp/endocrine: Supple,no lymphadenopathy CV: RRR  Chest: CTAB, no wheeze or crackles. Good air movement, normal resp effort.  Abd: Soft. NTND. BS present.  Neuro:  Normal gait. PERLA. EOMi. Alert. Oriented x3   Assessment/Plan: Heather Shah  is a 46 y.o. female present for  OV for  Bronchitis Rest, hydrate, mucinex DM. Doxycyline, hycodan.  F/U PRN   Reviewed expectations re: course of current medical issues.  Discussed self-management of symptoms.  Outlined signs and symptoms indicating need for more acute intervention.  Patient verbalized understanding and all questions were answered.  Patient received an After-Visit Summary.   electronically signed by:  Felix Pacini, DO  [facility] [place]

## 2016-11-09 ENCOUNTER — Telehealth: Payer: Self-pay | Admitting: Family Medicine

## 2016-11-09 NOTE — Telephone Encounter (Signed)
Patient does not think that doxycycline (VIBRA-TABS) 100 MG tablet is working. Patient also stated that her cycle has started again.   If there is another suggestion for medication please send it to  [id number] - [place], [place] - [phone] (Phone) [phone] (Fax)   Please call patient to advise. Okay to leave detailed message on patient phone.

## 2016-11-09 NOTE — Telephone Encounter (Signed)
Left message for patient she just started the antibiotic and needs to complete the course she can follow up if no improvement after completion. Recommend she follow up with her GYN regarding her cycle since they are seeing her for this problem.

## 2016-11-09 NOTE — Telephone Encounter (Signed)
Patient has picked up °

## 2016-11-27 ENCOUNTER — Other Ambulatory Visit: Payer: Self-pay | Admitting: Family Medicine

## 2016-11-27 DIAGNOSIS — Z1231 Encounter for screening mammogram for malignant neoplasm of breast: Secondary | ICD-10-CM

## 2016-12-21 ENCOUNTER — Ambulatory Visit
Admission: RE | Admit: 2016-12-21 | Discharge: 2016-12-21 | Disposition: A | Discharge status: Home / Self Care | Payer: 59 | Source: Ambulatory Visit | Attending: Family Medicine | Admitting: Family Medicine

## 2016-12-21 DIAGNOSIS — Z1231 Encounter for screening mammogram for malignant neoplasm of breast: Secondary | ICD-10-CM | POA: Diagnosis not present

## 2017-10-25 ENCOUNTER — Ambulatory Visit: Payer: 59 | Admitting: Obstetrics and Gynecology

## 2017-10-28 ENCOUNTER — Ambulatory Visit (INDEPENDENT_AMBULATORY_CARE_PROVIDER_SITE_OTHER): Payer: 59 | Admitting: Family Medicine

## 2017-10-28 ENCOUNTER — Encounter: Payer: Self-pay | Admitting: Family Medicine

## 2017-10-28 VITALS — BP 120/77 | HR 79 | Temp 98.4°F | Resp 20 | Ht 65.0 in | Wt 146.5 lb

## 2017-10-28 DIAGNOSIS — J0100 Acute maxillary sinusitis, unspecified: Secondary | ICD-10-CM | POA: Diagnosis not present

## 2017-10-28 MED ORDER — DOXYCYCLINE HYCLATE 100 MG PO TABS: 100.0000 mg | ORAL_TABLET | Freq: Two times a day (BID) | ORAL | 0 refills | Status: DC

## 2017-10-28 NOTE — Patient Instructions (Signed)
Rest, hydrate.  + flonase, mucinex (DM if cough), nettie pot or nasal saline.  Doxycyline (printed) prescribed, take until completed if started. Give your self another 24 hours to see if you continue to improve. If you get better, do not start antibiotic.  No afrin use after 3 days.  If cough present it can last up to 6-8 weeks.  F/U 2 weeks of not improved.    Sinusitis, Adult Sinusitis is soreness and inflammation of your sinuses. Sinuses are hollow spaces in the bones around your face. They are located:  Around your eyes.  In the middle of your forehead.  Behind your nose.  In your cheekbones.  Your sinuses and nasal passages are lined with a stringy fluid (mucus). Mucus normally drains out of your sinuses. When your nasal tissues get inflamed or swollen, the mucus can get trapped or blocked so air cannot flow through your sinuses. This lets bacteria, viruses, and funguses grow, and that leads to infection. Follow these instructions at home: Medicines  Take, use, or apply over-the-counter and prescription medicines only as told by your doctor. These may include nasal sprays.  If you were prescribed an antibiotic medicine, take it as told by your doctor. Do not stop taking the antibiotic even if you start to feel better. Hydrate and Humidify  Drink enough water to keep your pee (urine) clear or pale yellow.  Use a cool mist humidifier to keep the humidity level in your home above 50%.  Breathe in steam for 10-15 minutes, 3-4 times a day or as told by your doctor. You can do this in the bathroom while a hot shower is running.  Try not to spend time in cool or dry air. Rest  Rest as much as possible.  Sleep with your head raised (elevated).  Make sure to get enough sleep each night. General instructions  Put a warm, moist washcloth on your face 3-4 times a day or as told by your doctor. This will help with discomfort.  Wash your hands often with soap and water. If there  is no soap and water, use hand sanitizer.  Do not smoke. Avoid being around people who are smoking (secondhand smoke).  Keep all follow-up visits as told by your doctor. This is important. Contact a doctor if:  You have a fever.  Your symptoms get worse.  Your symptoms do not get better within 10 days. Get help right away if:  You have a very bad headache.  You cannot stop throwing up (vomiting).  You have pain or swelling around your face or eyes.  You have trouble seeing.  You feel confused.  Your neck is stiff.  You have trouble breathing. This information is not intended to replace advice given to you by your health care provider. Make sure you discuss any questions you have with your health care provider. Document Released: 01/30/2008 Document Revised: 04/08/2016 Document Reviewed: 06/08/2015 Elsevier Interactive Patient Education  [employer]2018 Else[employer] Inc[employer]

## 2017-10-28 NOTE — Progress Notes (Signed)
Heather Shah , May 31, 1971, 47 y.o., female MRN: [id number] Patient Care Team    Relationship Specialty Notifications Start End  Natalia LeatherwoodKuneff, Renee A, DO PCP - General Family Medicine  10/01/16   Romualdo BolkJertson, Jill Evelyn, MD Consulting Physician Obstetrics and Gynecology  10/01/16     Chief Complaint  Patient presents with  . URI    cough,congestion,drainage x 5 days     Subjective: Pt presents for an OV with complaints of productive cough of 5 days duration.  Associated symptoms include nasal and chest congestion, nasal drainage and nausea. Vomit x1 after coughing fit. She denies fever, chills, diarrhea or rash. Pt has tried mucinex, afrin and allergy med to ease their symptoms.   Depression screen PHQ 2/9 10/28/2017  Decreased Interest 0  Down, Depressed, Hopeless 0  PHQ - 2 Score 0    Allergies  Allergen Reactions  . Bactrim [Sulfamethoxazole-Trimethoprim] Anaphylaxis    hallucinating  . Penicillins     sneezing   Social History   Tobacco Use  . Smoking status: Never Smoker  . Smokeless tobacco: Never Used  Substance Use Topics  . Alcohol use: Yes    Alcohol/week: 3.6 oz    Types: 6 Glasses of wine per week   Past Medical History:  Diagnosis Date  . Anemia   . Anxiety   . Elevated hemoglobin A1c   . Heart murmur   . Hyperlipidemia    Past Surgical History:  Procedure Laterality Date  . BREAST BIOPSY  2004  . CESAREAN SECTION  2007  . DILATION AND EVACUATION  2010, 2012   x 2  . HYSTEROSCOPY W/D&C     Family History  Problem Relation Age of Onset  . Diabetes Mother   . Hypertension Mother   . Heart disease Mother   . Diabetes Father   . Alcohol abuse Father   . Hypertension Father   . Hyperlipidemia Father   . Heart disease Father   . Breast cancer Maternal Aunt 62  . Diabetes Paternal Aunt    Allergies as of 10/28/2017      Reactions   Bactrim [sulfamethoxazole-trimethoprim] Anaphylaxis   hallucinating   Penicillins    sneezing      Medication List        Accurate as of 10/28/17  9:32 AM. Always use your most recent med list.          BIOTIN FORTE PO Take by mouth.   BLACK COHOSH PO Take by mouth.   Probiotic 250 MG Caps Take by mouth.   Vitamin D (Cholecalciferol) 1000 units Caps Take by mouth.       All past medical history, surgical history, allergies, family history, immunizations andmedications were updated in the EMR today and reviewed under the history and medication portions of their EMR.     ROS: Negative, with the exception of above mentioned in HPI   Objective:  BP 120/77 (BP Location: Left Arm, Patient Position: Sitting, Cuff Size: Normal)   Pulse 79   Temp 98.4 F (36.9 C)   Resp 20   Ht 5\' 5"  (1.651 m)   Wt 146 lb 8 oz (66.5 kg)   LMP 10/17/2017   SpO2 96%   BMI 24.38 kg/m  Body mass index is 24.38 kg/m. Gen: Afebrile. No acute distress. Nontoxic in appearance, well developed, well nourished.  HENT: AT. [place]. Bilateral TM visualized right fullness, left retracted. MMM, no oral lesions. Bilateral nares with erythema and drainage. Throat without erythema or exudates.  Cough and hoarseness present.  Eyes:Pupils Equal Round Reactive to light, Extraocular movements intact,  Conjunctiva without redness, discharge or icterus. Neck/lymp/endocrine: Supple,bilateral ant cervical  lymphadenopathy CV: RRR Electronically  Chest: CTAB, no wheeze or crackles. Good air movement, normal resp effort.  Skin: no rashes, purpura or petechiae.  Neuro:  Normal gait. PERLA. EOMi. Alert. Oriented x3  No exam data present No results found. No results found for this or any previous visit (from the past 24 hour(s)).  Assessment/Plan: Heather Shah is a 47 y.o. female present for OV for  1. Acute maxillary sinusitis, recurrence not specified Rest, hydrate.  Stop afrin after 3 days use.  + flonase, mucinex (DM if cough), nettie pot or nasal saline.  Doxy start in 24 hours if does not improve prescribed, take until completed.   If cough present it can last up to 6-8 weeks.  F/U 2 weeks of not improved.     Reviewed expectations re: course of current medical issues.  Discussed self-management of symptoms.  Outlined signs and symptoms indicating need for more acute intervention.  Patient verbalized understanding and all questions were answered.  Patient received an After-Visit Summary.    No orders of the defined types were placed in this encounter.    Note is dictated utilizing voice recognition software. Although note has been proof read prior to signing, occasional typographical errors still can be missed. If any questions arise, please do not hesitate to call for verification.   electronically signed by:  Felix Pacini, DO  [facility] [place]

## 2017-11-01 ENCOUNTER — Encounter: Payer: 59 | Admitting: Family Medicine

## 2017-11-01 ENCOUNTER — Encounter: Payer: Self-pay | Admitting: Family Medicine

## 2017-11-01 ENCOUNTER — Ambulatory Visit (INDEPENDENT_AMBULATORY_CARE_PROVIDER_SITE_OTHER): Payer: 59 | Admitting: Family Medicine

## 2017-11-01 VITALS — BP 133/88 | HR 77 | Temp 98.0°F | Ht 65.0 in | Wt 147.8 lb

## 2017-11-01 DIAGNOSIS — R7309 Other abnormal glucose: Secondary | ICD-10-CM | POA: Diagnosis not present

## 2017-11-01 DIAGNOSIS — Z0000 Encounter for general adult medical examination without abnormal findings: Secondary | ICD-10-CM

## 2017-11-01 DIAGNOSIS — Z130 Encounter for screening for diseases of the blood and blood-forming organs and certain disorders involving the immune mechanism: Secondary | ICD-10-CM

## 2017-11-01 DIAGNOSIS — R635 Abnormal weight gain: Secondary | ICD-10-CM

## 2017-11-01 DIAGNOSIS — Z1231 Encounter for screening mammogram for malignant neoplasm of breast: Secondary | ICD-10-CM

## 2017-11-01 DIAGNOSIS — Z1239 Encounter for other screening for malignant neoplasm of breast: Secondary | ICD-10-CM

## 2017-11-01 LAB — CBC WITH DIFFERENTIAL/PLATELET
BASOS PCT: 0.9 % (ref 0.0–3.0)
Basophils Absolute: 0 10*3/uL (ref 0.0–0.1)
EOS PCT: 2.7 % (ref 0.0–5.0)
Eosinophils Absolute: 0.2 10*3/uL (ref 0.0–0.7)
HCT: 42.5 % (ref 36.0–46.0)
HEMOGLOBIN: 14.5 g/dL (ref 12.0–15.0)
Lymphocytes Relative: 30.7 % (ref 12.0–46.0)
Lymphs Abs: 1.8 10*3/uL (ref 0.7–4.0)
MCHC: 34.1 g/dL (ref 30.0–36.0)
MCV: 92.4 fl (ref 78.0–100.0)
Monocytes Absolute: 0.4 10*3/uL (ref 0.1–1.0)
Monocytes Relative: 7.6 % (ref 3.0–12.0)
Neutro Abs: 3.3 10*3/uL (ref 1.4–7.7)
Neutrophils Relative %: 58.1 % (ref 43.0–77.0)
Platelets: 366 10*3/uL (ref 150.0–400.0)
RBC: 4.6 Mil/uL (ref 3.87–5.11)
RDW: 13.8 % (ref 11.5–15.5)
WBC: 5.7 10*3/uL (ref 4.0–10.5)

## 2017-11-01 LAB — LIPID PANEL
CHOLESTEROL: 243 mg/dL — AB (ref 0–200)
HDL: 65.4 mg/dL (ref >39.00)
LDL Cholesterol: 163 mg/dL — ABNORMAL HIGH (ref 0–99)
NonHDL: 177.67
TRIGLYCERIDES: 75 mg/dL (ref 0.0–149.0)
Total CHOL/HDL Ratio: 4
VLDL: 15 mg/dL (ref 0.0–40.0)

## 2017-11-01 LAB — COMPREHENSIVE METABOLIC PANEL
ALBUMIN: 4.3 g/dL (ref 3.5–5.2)
ALT: 14 U/L (ref 0–35)
AST: 14 U/L (ref 0–37)
Alkaline Phosphatase: 59 U/L (ref 39–117)
BILIRUBIN TOTAL: 0.4 mg/dL (ref 0.2–1.2)
BUN: 13 mg/dL (ref 6–23)
CALCIUM: 9.9 mg/dL (ref 8.4–10.5)
CHLORIDE: 101 meq/L (ref 96–112)
CO2: 29 mEq/L (ref 19–32)
CREATININE: 0.85 mg/dL (ref 0.40–1.20)
GFR: 75.8 mL/min (ref >60.00)
Glucose, Bld: 92 mg/dL (ref 70–99)
Potassium: 4.5 mEq/L (ref 3.5–5.1)
Sodium: 138 mEq/L (ref 135–145)
Total Protein: 7.3 g/dL (ref 6.0–8.3)

## 2017-11-01 LAB — T4, FREE: Free T4: 0.71 ng/dL (ref 0.60–1.60)

## 2017-11-01 LAB — HEMOGLOBIN A1C: Hgb A1c MFr Bld: 5.3 % (ref 4.6–6.5)

## 2017-11-01 LAB — TSH: TSH: 1.94 u[IU]/mL (ref 0.35–4.50)

## 2017-11-01 NOTE — Progress Notes (Signed)
Patient ID: Heather Shah, female  DOB: Oct 16, 1970, 47 y.o.   MRN: [id number] Patient Care Team    Relationship Specialty Notifications Start End  Heather Shah, Heather Badley A, DO PCP - General Family Medicine  10/01/16   Heather Shah, Heather Evelyn, MD Consulting Physician Obstetrics and Gynecology  10/01/16     Subjective:  Heather Shah is Shah 47 y.o.  female present for new patient CPE. All past medical history, surgical history, allergies, family history, immunizations, medications and social history were obtained and entered  in the electronic medical record today. All recent labs, ED visits and hospitalizations within the last year were reviewed.  Recovering from recent URI, but doing well.   Health maintenance: updated 11/01/2017 Colonoscopy: No fhx, screen at 48.  Mammogram: 12/21/2016 birads 1. She has Shah Mat sunt with breast cancer in 5060s. Mammogram ordered at breast center.  Cervical cancer screening: last pap: 02/2016, results: abnormal cells, completed by: in [place]--> now goes to Dr. Oscar LaJertson.  Immunizations: tdap 10/01/2016, Influenza declined (encouraged yearly) Infectious disease screening: HIV completed Assistive device: none Oxygen NWG:NFAOuse:none Patient has [facility] home. Hospitalizations/ED visits: None  Immunization History  Administered Date(s) Administered  . Tdap 10/01/2016     Past Medical History:  Diagnosis Date  . Anemia   . Anxiety   . Elevated hemoglobin A1c   . Heart murmur   . Hyperlipidemia    Allergies  Allergen Reactions  . Bactrim [Sulfamethoxazole-Trimethoprim] Anaphylaxis    hallucinating  . Penicillins     sneezing   Past Surgical History:  Procedure Laterality Date  . BREAST BIOPSY  2004  . CESAREAN SECTION  2007  . DILATION AND EVACUATION  2010, 2012   x 2  . HYSTEROSCOPY W/D&C     Family History  Problem Relation Age of Onset  . Diabetes Mother   . Hypertension Mother   . Heart disease Mother   . Diabetes Father   . Alcohol abuse Father   .  Hypertension Father   . Hyperlipidemia Father   . Heart disease Father   . Breast cancer Maternal Aunt 62  . Diabetes Paternal Aunt    Social History   Socioeconomic History  . Marital status: Married    Spouse name: Heather Shah  . Number of children: 1  . Years of education: 6116  . Highest education level: Not on file  Social Needs  . Financial resource strain: Not on file  . Food insecurity - worry: Not on file  . Food insecurity - inability: Not on file  . Transportation needs - medical: Not on file  . Transportation needs - non-medical: Not on file  Occupational History  . Occupation: retirement Research scientist (medical)Consultant   Tobacco Use  . Smoking status: Never Smoker  . Smokeless tobacco: Never Used  Substance and Sexual Activity  . Alcohol use: Yes    Alcohol/week: 3.6 oz    Types: 6 Glasses of wine per week  . Drug use: No  . Sexual activity: Yes    Partners: Male    Birth control/protection: None    Comment: Married  Other Topics Concern  . Not on file  Social History Narrative   Married to "Heather Shah". One child.    BA, Designer, fashion/clothingetirement consultant.    [place]oved [place] [facility] [place] 02[place]2016.    Drinks caffeine. Uses herbal remedies.    Wears her seatbelt. Smoke detector in the home.    Firearms locked in the home.    Feels safe in her realtionships.  Allergies as of 11/01/2017      Reactions   Bactrim [sulfamethoxazole-trimethoprim] Anaphylaxis   hallucinating   Penicillins    sneezing      Medication List        Accurate as of 11/01/17 10:34 AM. Always use your most recent med list.          BIOTIN FORTE PO Take by mouth.   BLACK COHOSH PO Take by mouth.   Probiotic 250 MG Caps Take by mouth.   Vitamin D (Cholecalciferol) 1000 units Caps Take by mouth.        No results found for this or any previous visit (from the past 2160 hour(s)).  Patient was never admitted.   ROS: 14 pt review of systems performed and negative (unless mentioned in an HPI)  Objective: BP 133/88 (BP  Location: Right Arm, Patient Position: Sitting, Cuff Size: Normal)   Pulse 77   Temp 98 F (36.7 C) (Oral)   Ht 5\' 5"  (1.651 m)   Wt 147 lb 12.8 oz (67 kg)   LMP 10/22/2017   SpO2 99%   BMI 24.60 kg/m  Gen: Afebrile. No acute distress. Nontoxic in appearance well developed, well nourished. Pleasant caucasian female.  HENT: AT. [place]. Bilateral TM visualized and normal in appearance. MMM. Bilateral nares with mild erythema, swelling and drainage (recent URI). Throat without erythema or exudates. PND and cough present.  Eyes:Pupils Equal Round Reactive to light, Extraocular movements intact,  Conjunctiva without redness, discharge or icterus. Neck/lymp/endocrine: Supple,no lymphadenopathy, no thyromegaly CV: RRR no murmur, no edema, +2/4 P posterior tibialis pulses Chest: CTAB, no wheeze or crackles Abd: Soft. flat. NTND. BS present. no Masses palpated.  Skin: no rashes, purpura or petechiae.  Neuro/msk:  Normal gait. PERLA. EOMi. Alert. Oriented x3. Cranial nerves II through XII intact. Muscle strength 5/5 U/L Ext extremity. DTRs equal bilaterally. Psych: Normal affect, dress and demeanor. Normal speech. Normal thought content and judgment.   Assessment/plan: Heather Shah is Shah 47 y.o. female present for establishment of care with CPE.  Encounter for preventive health examination Patient was encouraged to exercise greater than 150 minutes Shah week. Patient was encouraged to choose Shah diet filled with fresh fruits and vegetables, and lean meats. AVS provided to patient today for education/recommendation on gender specific health and safety maintenance. Colonoscopy: No fhx, screen at 48.  Mammogram: 12/21/2016 birads 1. She has Shah Mat sunt with breast cancer in 67s. Mammogram ordered at breast center.  Cervical cancer screening: last pap: 02/2016, results: abnormal cells, completed by: in [place]--> now goes to Dr. Oscar Shah.  Immunizations: tdap 10/01/2016, Influenza declined (encouraged  yearly) Infectious disease screening: HIV completed Elevated hemoglobin A1c/weight gain - diet and exercise counseling. - Comprehensive metabolic panel - Lipid panel - Hemoglobin A1c - TSH - T4, free Screening for deficiency anemia - CBC with Differential/Platelet   Return in about 1 year (around 11/02/2018) for CPE.  Electronically signed by: Felix Pacini, DO [facility] [place]

## 2017-11-01 NOTE — Patient Instructions (Signed)

## 2017-11-04 ENCOUNTER — Encounter: Payer: Self-pay | Admitting: Family Medicine

## 2017-11-04 ENCOUNTER — Other Ambulatory Visit (HOSPITAL_COMMUNITY)
Admission: RE | Admit: 2017-11-04 | Discharge: 2017-11-04 | Disposition: A | Discharge status: Home / Self Care | Payer: 59 | Source: Ambulatory Visit | Attending: Obstetrics and Gynecology | Admitting: Obstetrics and Gynecology

## 2017-11-04 ENCOUNTER — Encounter: Payer: Self-pay | Admitting: Obstetrics and Gynecology

## 2017-11-04 ENCOUNTER — Other Ambulatory Visit: Payer: Self-pay

## 2017-11-04 ENCOUNTER — Ambulatory Visit (INDEPENDENT_AMBULATORY_CARE_PROVIDER_SITE_OTHER): Payer: 59 | Admitting: Obstetrics and Gynecology

## 2017-11-04 VITALS — BP 108/80 | HR 72 | Resp 14 | Ht 65.0 in | Wt 147.0 lb

## 2017-11-04 DIAGNOSIS — R351 Nocturia: Secondary | ICD-10-CM | POA: Diagnosis not present

## 2017-11-04 DIAGNOSIS — R140 Abdominal distension (gaseous): Secondary | ICD-10-CM

## 2017-11-04 DIAGNOSIS — E785 Hyperlipidemia, unspecified: Secondary | ICD-10-CM | POA: Insufficient documentation

## 2017-11-04 DIAGNOSIS — N393 Stress incontinence (female) (male): Secondary | ICD-10-CM | POA: Diagnosis not present

## 2017-11-04 DIAGNOSIS — Z124 Encounter for screening for malignant neoplasm of cervix: Secondary | ICD-10-CM

## 2017-11-04 DIAGNOSIS — R197 Diarrhea, unspecified: Secondary | ICD-10-CM | POA: Diagnosis not present

## 2017-11-04 DIAGNOSIS — Z01419 Encounter for gynecological examination (general) (routine) without abnormal findings: Secondary | ICD-10-CM | POA: Insufficient documentation

## 2017-11-04 DIAGNOSIS — N3281 Overactive bladder: Secondary | ICD-10-CM

## 2017-11-04 DIAGNOSIS — Z87410 Personal history of cervical dysplasia: Secondary | ICD-10-CM | POA: Insufficient documentation

## 2017-11-04 NOTE — Patient Instructions (Signed)
EXERCISE AND DIET:  We recommended that you start or continue a regular exercise program for good health. Regular exercise means any activity that makes your heart beat faster and makes you sweat.  We recommend exercising at least 30 minutes per day at least 3 days a week, preferably 4 or 5.  We also recommend a diet low in fat and sugar.  Inactivity, poor dietary choices and obesity can cause diabetes, heart attack, stroke, and kidney damage, among others.    ALCOHOL AND SMOKING:  Women should limit their alcohol intake to no more than 7 drinks/beers/glasses of wine (combined, not each!) per week. Moderation of alcohol intake to this level decreases your risk of breast cancer and liver damage. And of course, no recreational drugs are part of a healthy lifestyle.  And absolutely no smoking or even second hand smoke. Most people know smoking can cause heart and lung diseases, but did you know it also contributes to weakening of your bones? Aging of your skin?  Yellowing of your teeth and nails?  CALCIUM AND VITAMIN D:  Adequate intake of calcium and Vitamin D are recommended.  The recommendations for exact amounts of these supplements seem to change often, but generally speaking 600 mg of calcium (either carbonate or citrate) and 800 units of Vitamin D per day seems prudent. Certain women may benefit from higher intake of Vitamin D.  If you are among these women, your doctor will have told you during your visit.    PAP SMEARS:  Pap smears, to check for cervical cancer or precancers,  have traditionally been done yearly, although recent scientific advances have shown that most women can have pap smears less often.  However, every woman still should have a physical exam from her gynecologist every year. It will include a breast check, inspection of the vulva and vagina to check for abnormal growths or skin changes, a visual exam of the cervix, and then an exam to evaluate the size and shape of the uterus and  ovaries.  And after 49 years of age, a rectal exam is indicated to check for rectal cancers. We will also provide age appropriate advice regarding health maintenance, like when you should have certain vaccines, screening for sexually transmitted diseases, bone density testing, colonoscopy, mammograms, etc.   MAMMOGRAMS:  All women over 40 years old should have a yearly mammogram. Many facilities now offer a "3D" mammogram, which may cost around $50 extra out of pocket. If possible,  we recommend you accept the option to have the 3D mammogram performed.  It both reduces the number of women who will be called back for extra views which then turn out to be normal, and it is better than the routine mammogram at detecting truly abnormal areas.    COLONOSCOPY:  Colonoscopy to screen for colon cancer is recommended for all women at age 50.  We know, you hate the idea of the prep.  We agree, BUT, having colon cancer and not knowing it is worse!!  Colon cancer so often starts as a polyp that can be seen and removed at colonscopy, which can quite literally save your life!  And if your first colonoscopy is normal and you have no family history of colon cancer, most women don't have to have it again for 10 years.  Once every ten years, you can do something that may end up saving your life, right?  We will be happy to help you get it scheduled when you are ready.    Be sure to check your insurance coverage so you understand how much it will cost.  It may be covered as a preventative service at no cost, but you should check your particular policy.      Overactive Bladder, Adult Overactive bladder is a group of urinary symptoms. With overactive bladder, you may suddenly feel the need to pass urine (urinate) right away. After feeling this sudden urge, you might also leak urine if you cannot get to the bathroom fast enough (urinary incontinence). These symptoms might interfere with your daily work or social activities.  Overactive bladder symptoms may also wake you up at night. Overactive bladder affects the nerve signals between your bladder and your brain. Your bladder may get the signal to empty before it is full. Very sensitive muscles can also make your bladder squeeze too soon. What are the causes? Many things can cause an overactive bladder. Possible causes include:  Urinary tract infection.  Infection of nearby tissues, such as the prostate.  Prostate enlargement.  Being pregnant with twins or more (multiples).  Surgery on the uterus or urethra.  Bladder stones, inflammation, or tumors.  Drinking too much caffeine or alcohol.  Certain medicines, especially those that you take to help your body get rid of extra fluid (diuretics) by increasing urine production.  Muscle or nerve weakness, especially from: ? A spinal cord injury. ? Stroke. ? Multiple sclerosis. ? Parkinson disease.  Diabetes. This can cause a high urine volume that fills the bladder so quickly that the normal urge to urinate is triggered very strongly.  Constipation. A buildup of too much stool can put pressure on your bladder.  What increases the risk? You may be at greater risk for overactive bladder if you:  Are an older adult.  Smoke.  Are going through menopause.  Have prostate problems.  Have a neurological disease, such as stroke, dementia, Parkinson disease, or multiple sclerosis (MS).  Eat or drink things that irritate the bladder. These include alcohol, spicy food, and caffeine.  Are overweight or obese.  What are the signs or symptoms? The signs and symptoms of an overactive bladder include:  Sudden, strong urges to urinate.  Leaking urine.  Urinating eight or more times per day.  Waking up to urinate two or more times per night.  How is this diagnosed? Your health care provider may suspect overactive bladder based on your symptoms. The health care provider will do a physical exam and take  your medical history. Blood or urine tests may also be done. For example, you might need to have a bladder function test to check how well you can hold your urine. You might also need to see a health care provider who specializes in the urinary tract (urologist). How is this treated? Treatment for overactive bladder depends on the cause of your condition and whether it is mild or severe. Certain treatments can be done in your health care provider's office or clinic. You can also make lifestyle changes at home. Options include: Behavioral Treatments  Biofeedback. A specialist uses sensors to help you become aware of your body's signals.  Keeping a daily log of when you need to urinate and what happens after the urge. This may help you manage your condition.  Bladder training. This helps you learn to control the urge to urinate by following a schedule that directs you to urinate at regular intervals (timed voiding). At first, you might have to wait a few minutes after feeling the urge. In time,   you should be able to schedule bathroom visits an hour or more apart.  Kegel exercises. These are exercises to strengthen the pelvic floor muscles, which support the bladder. Toning these muscles can help you control urination, even if your bladder muscles are overactive. A specialist will teach you how to do these exercises correctly. They require daily practice.  Weight loss. If you are obese or overweight, losing weight might relieve your symptoms of overactive bladder. Talk to your health care provider about losing weight and whether there is a specific program or method that would work best for you.  Diet change. This might help if constipation is making your overactive bladder worse. Your health care provider or a dietitian can explain ways to change what you eat to ease constipation. You might also need to consume less alcohol and caffeine or drink other fluids at different times of the day.  Stopping  smoking.  Wearing pads to absorb leakage while you wait for other treatments to take effect. Physical Treatments  Electrical stimulation. Electrodes send gentle pulses of electricity to strengthen the nerves or muscles that help to control the bladder. Sometimes, the electrodes are placed outside of the body. In other cases, they might be placed inside the body (implanted). This treatment can take several months to have an effect.  Supportive devices. Women may need a plastic device that fits into the vagina and supports the bladder (pessary). Medicines Several medicines can help treat overactive bladder and are usually used along with other treatments. Some are injected into the muscles involved in urination. Others come in pill form. Your health care provider may prescribe:  Antispasmodics. These medicines block the signals that the nerves send to the bladder. This keeps the bladder from releasing urine at the wrong time.  Tricyclic antidepressants. These types of antidepressants also relax bladder muscles.  Surgery  You may have a device implanted to help manage the nerve signals that indicate when you need to urinate.  You may have surgery to implant electrodes for electrical stimulation.  Sometimes, very severe cases of overactive bladder require surgery to change the shape of the bladder. Follow these instructions at home:  Take medicines only as directed by your health care provider.  Use any implants or a pessary as directed by your health care provider.  Make any diet or lifestyle changes that are recommended by your health care provider. These might include: ? Drinking less fluid or drinking at different times of the day. If you need to urinate often during the night, you may need to stop drinking fluids early in the evening. ? Cutting down on caffeine or alcohol. Both can make an overactive bladder worse. Caffeine is found in coffee, tea, and sodas. ? Doing Kegel exercises  to strengthen muscles. ? Losing weight if you need to. ? Eating a healthy and balanced diet to prevent constipation.  Keep a journal or log to track how much and when you drink and also when you feel the need to urinate. This will help your health care provider to monitor your condition. Contact a health care provider if:  Your symptoms do not get better after treatment.  Your pain and discomfort are getting worse.  You have more frequent urges to urinate.  You have a fever. Get help right away if: You are not able to control your bladder at all. This information is not intended to replace advice given to you by your health care provider. Make sure you discuss any questions   you have with your health care provider. Document Released: 06/09/2009 Document Revised: 01/19/2016 Document Reviewed: 01/06/2014 Elsevier Interactive Patient Education  2018 [employer]  

## 2017-11-04 NOTE — Progress Notes (Signed)
47 y.o. [id number] MarriedCaucasianF here for annual exam.   Cycles q 28-30 days x 5 days. Can saturate a pad in 2-3 hours. No cramps other than GI related. W/D for contraception.  Period Cycle (Days): 30 Period Duration (Days): 5 Period Pattern: (!) Irregular Menstrual Flow: Moderate Menstrual Control: Maxi pad Dysmenorrhea: (!) Mild Dysmenorrhea Symptoms: Throbbing  She c/o increase in abdominal bloating intermittently for the last year. She has 2-3 BM's a day, soft, occasionally watery. No blood. No change for years. She c/o increase in Gas.  She c/o frequent urination, small amounts, has nocturia x 3, urgency to void. These symptoms her whole life. She tried detrol in the past, didn't like it.  She c/o GSI in the last years. Small amounts, can go weeks and not leak.  She c/o intermittent vulvar boils.  She is having tolerable vasomotor symptoms. No vaginal dryness. She notes an increase in mucous d/c. No itching, burning or irritation. Low libido.  Patient's last menstrual period was 10/22/2017.          Sexually active: Yes.    The current method of family planning is none.    Exercising: No.  The patient does not participate in regular exercise at present. Smoker:  no  Health Maintenance: Pap:  02/20/16 LGSIL, Colpo-wnl, satisfactory, negative ECC History of abnormal Pap:  yes MMG:  12/21/16 BIRADS 1 negative Colonoscopy:  n/a BMD:   n/a TDaP:  10/01/16  Gardasil: n/a   reports that  has never smoked. she has never used smokeless tobacco. She reports that she drinks about 3.6 oz of alcohol per week. She reports that she does not use drugs. She works in [employer], travels for work. Daughter is 12 this week  Past Medical History:  Diagnosis Date  . Anemia   . Anxiety   . Elevated hemoglobin A1c   . Heart murmur   . Hyperlipidemia     Past Surgical History:  Procedure Laterality Date  . BREAST BIOPSY  2004  . CESAREAN SECTION  2007  . DILATION AND EVACUATION   2010, 2012   x 2  . HYSTEROSCOPY W/D&C      Current Outpatient Medications  Medication Sig Dispense Refill  . BIOTIN FORTE PO Take by mouth.    Marland Kitchen BLACK COHOSH PO Take by mouth.    . Fluticasone Propionate (FLONASE NA) Place into the nose.    . GuaiFENesin (MUCINEX PO) Take by mouth.    . Saccharomyces boulardii (PROBIOTIC) 250 MG CAPS Take by mouth.    . Vitamin D, Cholecalciferol, 1000 units CAPS Take by mouth.     No current facility-administered medications for this visit.     Family History  Problem Relation Age of Onset  . Diabetes Mother   . Hypertension Mother   . Heart disease Mother   . Diabetes Father   . Alcohol abuse Father   . Hypertension Father   . Hyperlipidemia Father   . Heart disease Father   . Breast cancer Maternal Aunt 62  . Diabetes Paternal Aunt   Father died in December 02, 2022  Review of Systems  Constitutional:       Weight gain  HENT: Positive for sinus pressure and sinus pain.   Eyes: Negative.   Respiratory: Negative.   Cardiovascular:       Heart murmur  Gastrointestinal: Positive for abdominal distention.  Endocrine: Negative.   Genitourinary: Positive for frequency.       Menstrual cycle changes Loss of urine with  cough and sneeze Night urination  Vaginal itching/irritation  Musculoskeletal: Negative.   Skin: Negative.   Allergic/Immunologic: Negative.   Neurological: Negative.   Hematological: Negative.   Psychiatric/Behavioral: Negative.   Vaginal itching is always with a boil.  Exam:   BP 108/80 (BP Location: Right Arm, Patient Position: Sitting, Cuff Size: Normal)   Pulse 72   Resp 14   Ht 5\' 5"  (1.651 m)   Wt 147 lb (66.7 kg)   LMP 10/22/2017   BMI 24.46 kg/m   Weight change: @WEIGHTCHANGE @ Height:   Height: 5\' 5"  (165.1 cm)  Ht Readings from Last 3 Encounters:  11/04/17 5\' 5"  (1.651 m)  11/01/17 5\' 5"  (1.651 m)  10/28/17 5\' 5"  (1.651 m)    General appearance: alert, cooperative and appears stated age Head: Normocephalic,  without obvious abnormality, atraumatic Neck: no adenopathy, supple, symmetrical, trachea midline and thyroid normal to inspection and palpation Lungs: clear to auscultation bilaterally Cardiovascular: regular rate and rhythm Breasts: normal appearance, no masses or tenderness Abdomen: soft, non-tender; non distended,  no masses,  no organomegaly Extremities: extremities normal, atraumatic, no cyanosis or edema Skin: Skin color, texture, turgor normal. No rashes or lesions Lymph nodes: Cervical, supraclavicular, and axillary nodes normal. No abnormal inguinal nodes palpated Neurologic: Grossly normal   Pelvic: External genitalia:  no lesions              Urethra:  normal appearing urethra with no masses, tenderness or lesions              Bartholins and Skenes: normal                 Vagina: normal appearing vagina with normal color and discharge, no lesions              Cervix: no lesions               Bimanual Exam:  Uterus:  normal size, contour, position, consistency, mobility, non-tender and anteverted              Adnexa: no mass, fullness, tenderness               Rectovaginal: Confirms               Anus:  normal sphincter tone, no lesions  Chaperone was present for exam.  A:  Well Woman with normal exam  H/O abnormal pap   IBS, bloating, loose stools  OAB, new onset GSI  P:   Pap with hpv  CCUA for ua, c&s  Referral with GI  Labs with primary  Discussed breast self exam  Discussed calcium and vit D intake  Mammogram next month

## 2017-11-05 LAB — CYTOLOGY - PAP
DIAGNOSIS: NEGATIVE
HPV (WINDOPATH): NOT DETECTED

## 2017-11-05 LAB — URINALYSIS, MICROSCOPIC ONLY
Casts: NONE SEEN /lpf
Epithelial Cells (non renal): NONE SEEN /hpf (ref 0–10)

## 2017-11-05 LAB — URINE CULTURE: Organism ID, Bacteria: NO GROWTH

## 2017-11-06 ENCOUNTER — Encounter: Payer: Self-pay | Admitting: Gastroenterology

## 2017-12-06 ENCOUNTER — Other Ambulatory Visit: Payer: Self-pay | Admitting: Family Medicine

## 2017-12-06 DIAGNOSIS — Z1231 Encounter for screening mammogram for malignant neoplasm of breast: Secondary | ICD-10-CM

## 2018-01-03 ENCOUNTER — Ambulatory Visit
Admission: RE | Admit: 2018-01-03 | Discharge: 2018-01-03 | Disposition: A | Discharge status: Home / Self Care | Payer: 59 | Source: Ambulatory Visit | Attending: Family Medicine | Admitting: Family Medicine

## 2018-01-03 DIAGNOSIS — Z1231 Encounter for screening mammogram for malignant neoplasm of breast: Secondary | ICD-10-CM | POA: Diagnosis not present

## 2018-01-21 ENCOUNTER — Ambulatory Visit (INDEPENDENT_AMBULATORY_CARE_PROVIDER_SITE_OTHER): Payer: 59 | Admitting: Gastroenterology

## 2018-01-21 ENCOUNTER — Other Ambulatory Visit (INDEPENDENT_AMBULATORY_CARE_PROVIDER_SITE_OTHER): Payer: 59

## 2018-01-21 ENCOUNTER — Encounter: Payer: Self-pay | Admitting: Gastroenterology

## 2018-01-21 VITALS — BP 114/72 | HR 76 | Ht 65.0 in | Wt 148.1 lb

## 2018-01-21 DIAGNOSIS — R1030 Lower abdominal pain, unspecified: Secondary | ICD-10-CM | POA: Diagnosis not present

## 2018-01-21 DIAGNOSIS — R140 Abdominal distension (gaseous): Secondary | ICD-10-CM

## 2018-01-21 DIAGNOSIS — R197 Diarrhea, unspecified: Secondary | ICD-10-CM

## 2018-01-21 LAB — IGA: IgA: 257 mg/dL (ref 68–378)

## 2018-01-21 MED ORDER — NA SULFATE-K SULFATE-MG SULF 17.5-3.13-1.6 GM/177ML PO SOLN: ORAL | 0 refills | Status: DC

## 2018-01-21 NOTE — Patient Instructions (Signed)
Go to the basement for labs today  You have been scheduled for a colonoscopy. Please follow written instructions given to you at your visit today.  Please pick up your prep supplies at the pharmacy within the next 1-3 days. If you use inhalers (even only as needed), please bring them with you on the day of your procedure. Your physician has requested that you go to www.startemmi.com and enter the access code given to you at your visit today. This web site gives a general overview about your procedure. However, you should still follow specific instructions given to you by our office regarding your preparation for the procedure.  LOW FODMAP diet handout given

## 2018-01-21 NOTE — Progress Notes (Signed)
Heather Shah    [id number]    November 30, 1970  Primary Care Physician:Kuneff, Reinaldo Raddle, DO  Referring Physician: Salvadore Dom, [address] [place], [place] [address]  Chief complaint:  Abdominal pain, diarrhea, bloating  HPI: 47 year old female here for new patient visit with complaints of abdominal pain, diarrhea, bloating and lower back pain Patient has history of anorexia, was taking laxatives for about 15 years since age 45 she subsequently stopped doing that and no longer has symptoms of anorexia .  She is currently not under going any treatment . She started having symptoms of intermittent diarrhea for the past few years with 2-3 bowel movements daily , she is not bothered by the increased stool frequency, actually feels better when she has multiple bowel movements.  Patient states she feels full if she does not have a bowel movement.  She has sharp lower abdominal pain radiating to her back intermittently.  Her mother had diverticulitis.  Maternal aunt has Crohn's disease and her daughter recently diagnosed with irritable bowel syndrome. Always feels bloated and has excessive flatulence.  She tried probiotics with no significant improvement Denies any nausea, vomiting, melena or bright red blood per rectum No family history of colon cancer or GI malignancy   Outpatient Encounter Medications as of 01/21/2018  Medication Sig  . BIOTIN FORTE PO Take by mouth.  Marland Kitchen BLACK COHOSH PO Take by mouth.  . cetirizine (ZYRTEC) 10 MG tablet Take 10 mg by mouth daily.  . Fluticasone Propionate (FLONASE NA) Place into the nose.  . Omega-3 Fatty Acids (FISH OIL) 1000 MG CAPS Take by mouth daily.  . Saccharomyces boulardii (PROBIOTIC) 250 MG CAPS Take by mouth.  . Vitamin D, Cholecalciferol, 1000 units CAPS Take by mouth.  . [DISCONTINUED] GuaiFENesin (MUCINEX PO) Take by mouth.  . Na Sulfate-K Sulfate-Mg Sulf 17.5-3.13-1.6 GM/177ML SOLN 1 prep kit   No  facility-administered encounter medications on file as of 01/21/2018.     Allergies as of 01/21/2018 - Review Complete 01/21/2018  Allergen Reaction Noted  . Bactrim [sulfamethoxazole-trimethoprim] Anaphylaxis 10/01/2016  . Penicillins  10/01/2016    Past Medical History:  Diagnosis Date  . Anemia   . Anxiety   . Elevated hemoglobin A1c   . Heart murmur   . Hyperlipidemia     Past Surgical History:  Procedure Laterality Date  . BREAST BIOPSY  2004  . CESAREAN SECTION  2007  . DILATION AND EVACUATION  2010, 2012   x 2  . HYSTEROSCOPY W/D&C      Family History  Problem Relation Age of Onset  . Diabetes Mother   . Hypertension Mother   . Heart disease Mother   . Diabetes Father   . Alcohol abuse Father   . Hypertension Father   . Hyperlipidemia Father   . Heart disease Father   . Breast cancer Maternal Aunt 62  . Diabetes Paternal Aunt     Social History   Socioeconomic History  . Marital status: Married    Spouse name: [name]  . Number of children: 1  . Years of education: 51  . Highest education level: Not on file  Occupational History  . Occupation: retirement Optometrist   Social Needs  . Financial resource strain: Not on file  . Food insecurity:    Worry: Not on file    Inability: Not on file  . Transportation needs:    Medical: Not on file  Non-medical: Not on file  Tobacco Use  . Smoking status: Never Smoker  . Smokeless tobacco: Never Used  Substance and Sexual Activity  . Alcohol use: Yes    Alcohol/week: 3.6 oz    Types: 6 Glasses of wine per week  . Drug use: No  . Sexual activity: Yes    Partners: Male    Birth control/protection: None    Comment: Married  Lifestyle  . Physical activity:    Days per week: Not on file    Minutes per session: Not on file  . Stress: Not on file  Relationships  . Social connections:    Talks on phone: Not on file    Gets together: Not on file    Attends religious service: Not on file    Active member  of club or organization: Not on file    Attends meetings of clubs or organizations: Not on file    Relationship status: Not on file  . Intimate partner violence:    Fear of current or ex partner: Not on file    Emotionally abused: Not on file    Physically abused: Not on file    Forced sexual activity: Not on file  Other Topics Concern  . Not on file  Social History Narrative   Married to "[name]". One child.    BA, Art therapist.    Moved from [facility] 02/2016.    Drinks caffeine. Uses herbal remedies.    Wears her seatbelt. Smoke detector in the home.    Firearms locked in the home.    Feels safe in her realtionships.       Review of systems: Review of Systems  Constitutional: Negative for fever and chills.  HENT: Negative.   Eyes: Negative for blurred vision.  Respiratory: Negative for cough, shortness of breath and wheezing.   Cardiovascular: Negative for chest pain and palpitations.  Gastrointestinal: as per HPI Genitourinary: Negative for dysuria, urgency, frequency and hematuria.  Musculoskeletal: Negative for myalgias, back pain and joint pain.  Skin: Negative for itching and rash.  Neurological: Negative for dizziness, tremors, focal weakness, seizures and loss of consciousness.  Endo/Heme/Allergies: Positive for seasonal allergies.  Psychiatric/Behavioral: Negative for depression, suicidal ideas and hallucinations.  All other systems reviewed and are negative.   Physical Exam: Vitals:   01/21/18 0921  BP: 114/72  Pulse: 76   Body mass index is 24.65 kg/m. 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 All questions answered. The patient agrees to proceed.     Damaris Hippo , MD [phone]    CC: Salvadore Dom, MD

## 2018-01-22 LAB — TISSUE TRANSGLUTAMINASE, IGA: (TTG) AB, IGA: 1 U/mL

## 2018-01-24 ENCOUNTER — Encounter: Payer: Self-pay | Admitting: Gastroenterology

## 2018-03-17 ENCOUNTER — Encounter: Payer: 59 | Admitting: Gastroenterology

## 2018-03-26 ENCOUNTER — Encounter: Payer: 59 | Admitting: Gastroenterology

## 2018-06-19 ENCOUNTER — Ambulatory Visit (INDEPENDENT_AMBULATORY_CARE_PROVIDER_SITE_OTHER): Payer: 59 | Admitting: Otolaryngology

## 2018-06-19 DIAGNOSIS — K219 Gastro-esophageal reflux disease without esophagitis: Secondary | ICD-10-CM

## 2018-06-19 DIAGNOSIS — R490 Dysphonia: Secondary | ICD-10-CM

## 2018-06-19 DIAGNOSIS — R05 Cough: Secondary | ICD-10-CM

## 2018-07-15 DIAGNOSIS — K219 Gastro-esophageal reflux disease without esophagitis: Secondary | ICD-10-CM | POA: Diagnosis not present

## 2018-07-15 DIAGNOSIS — R05 Cough: Secondary | ICD-10-CM | POA: Diagnosis not present

## 2018-11-19 ENCOUNTER — Ambulatory Visit: Payer: 59 | Admitting: Obstetrics and Gynecology

## 2019-01-20 ENCOUNTER — Encounter: Payer: Self-pay | Admitting: Obstetrics and Gynecology

## 2019-01-20 ENCOUNTER — Ambulatory Visit (INDEPENDENT_AMBULATORY_CARE_PROVIDER_SITE_OTHER): Payer: Managed Care, Other (non HMO) | Admitting: Obstetrics and Gynecology

## 2019-01-20 ENCOUNTER — Other Ambulatory Visit: Payer: Self-pay

## 2019-01-20 VITALS — BP 120/82 | HR 72 | Temp 97.7°F | Ht 65.0 in | Wt 152.8 lb

## 2019-01-20 DIAGNOSIS — R635 Abnormal weight gain: Secondary | ICD-10-CM

## 2019-01-20 DIAGNOSIS — N926 Irregular menstruation, unspecified: Secondary | ICD-10-CM | POA: Diagnosis not present

## 2019-01-20 DIAGNOSIS — Z01419 Encounter for gynecological examination (general) (routine) without abnormal findings: Secondary | ICD-10-CM | POA: Diagnosis not present

## 2019-01-20 DIAGNOSIS — Z1211 Encounter for screening for malignant neoplasm of colon: Secondary | ICD-10-CM

## 2019-01-20 DIAGNOSIS — Z0000 Encounter for general adult medical examination without abnormal findings: Secondary | ICD-10-CM | POA: Diagnosis not present

## 2019-01-20 DIAGNOSIS — N914 Secondary oligomenorrhea: Secondary | ICD-10-CM

## 2019-01-20 DIAGNOSIS — Z833 Family history of diabetes mellitus: Secondary | ICD-10-CM

## 2019-01-20 DIAGNOSIS — R5383 Other fatigue: Secondary | ICD-10-CM

## 2019-01-20 NOTE — Progress Notes (Signed)
48 y.o. G25P1021 Married White or Caucasian Not Hispanic or Latino female here for annual exam.    Long term issues with OAB symptoms, tried detrol in the past, didn't like it. Occasional GSI, no urge incontinence.   Period Duration (Days): 5 days, spotting after Period Pattern: (!) Irregular Menstrual Flow: Moderate, Heavy Menstrual Control: Maxi pad, Thin pad Menstrual Control Change Freq (Hours): changes pad every 2 hours Dysmenorrhea: None  Cycles were regular up until the last year. In the last year cycles are about every 2 months. Some fatigue. Weight gain.  She has bad reflux, ENT put her on Prilosec.   Not sleeping well. Wakes up a bunch. Not having regular vasomotor symptoms.   Patient's last menstrual period was 01/10/2019 (exact date).          Sexually active: Yes.    The current method of family planning is none.    Exercising: Yes.    walking, riding horses Smoker:  no  Health Maintenance: Pap:  11/04/2017 WNL NEG HPV, 02/20/16 LGSIL, Colpo-wnl, satisfactory, negative ECC History of abnormal Pap:  yes MMG:  01/03/2018 Birads 1 negative Colonoscopy:  n/a BMD:   n/a TDaP:  10/01/16  Gardasil: n/a   reports that she has never smoked. She has never used smokeless tobacco. She reports current alcohol use. She reports that she does not use drugs. Works in [employer]. Daughter is 60  Past Medical History:  Diagnosis Date  . Anemia   . Anxiety   . Elevated hemoglobin A1c   . Heart murmur   . Hyperlipidemia   . Laryngopharyngeal reflux (LPR)     Past Surgical History:  Procedure Laterality Date  . BREAST BIOPSY  2004  . CESAREAN SECTION  2007  . DILATION AND EVACUATION  2010, 2012   x 2  . HYSTEROSCOPY W/D&C      Current Outpatient Medications  Medication Sig Dispense Refill  . b complex vitamins tablet Take 1 tablet by mouth daily.    Marland Kitchen BIOTIN FORTE PO Take by mouth.    Marland Kitchen BLACK COHOSH PO Take by mouth.    . cetirizine (ZYRTEC) 10 MG tablet Take 10 mg  by mouth daily.    . Omega-3 Fatty Acids (FISH OIL) 1000 MG CAPS Take by mouth daily.    Marland Kitchen omeprazole (PRILOSEC) 20 MG capsule Take 20 mg by mouth daily.    . Saccharomyces boulardii (PROBIOTIC) 250 MG CAPS Take by mouth.    . Vitamin D, Cholecalciferol, 1000 units CAPS Take by mouth.     No current facility-administered medications for this visit.     Family History  Problem Relation Age of Onset  . Diabetes Mother   . Hypertension Mother   . Heart disease Mother   . Diabetes Father   . Alcohol abuse Father   . Hypertension Father   . Hyperlipidemia Father   . Heart disease Father   . Breast cancer Maternal Aunt 62  . Diabetes Paternal Aunt     Review of Systems  Constitutional:       Weight gain  HENT: Negative.   Eyes: Negative.   Respiratory: Negative.   Cardiovascular: Negative.   Gastrointestinal: Negative.   Endocrine: Negative.   Genitourinary: Negative.   Musculoskeletal: Negative.   Skin: Negative.   Allergic/Immunologic: Negative.   Neurological: Negative.   Hematological: Negative.   Psychiatric/Behavioral: Negative.     Exam:   BP 120/82 (BP Location: Right Arm, Patient Position: Sitting, Cuff Size: Normal)  Pulse 72   Temp 97.7 F (36.5 C) (Skin)   Ht 5\' 5"  (1.651 m)   Wt 152 lb 12.8 oz (69.3 kg)   LMP 01/10/2019 (Exact Date)   BMI 25.43 kg/m   Weight change: @WEIGHTCHANGE @ Height:   Height: 5\' 5"  (165.1 cm)  Ht Readings from Last 3 Encounters:  01/20/19 5\' 5"  (1.651 m)  01/21/18 5\' 5"  (1.651 m)  11/04/17 5\' 5"  (1.651 m)    General appearance: alert, cooperative and appears stated age Head: Normocephalic, without obvious abnormality, atraumatic Neck: no adenopathy, supple, symmetrical, trachea midline and thyroid normal to inspection and palpation Lungs: clear to auscultation bilaterally Cardiovascular: regular rate and rhythm Breasts: normal appearance, no masses or tenderness Abdomen: soft, non-tender; non distended,  no masses,  no  organomegaly Extremities: extremities normal, atraumatic, no cyanosis or edema Skin: Skin color, texture, turgor normal. No rashes or lesions Lymph nodes: Cervical, supraclavicular, and axillary nodes normal. No abnormal inguinal nodes palpated Neurologic: Grossly normal   Pelvic: External genitalia:  no lesions              Urethra:  normal appearing urethra with no masses, tenderness or lesions              Bartholins and Skenes: normal                 Vagina: normal appearing vagina with normal color and discharge, no lesions              Cervix: no lesions               Bimanual Exam:  Uterus:  normal size, contour, position, consistency, mobility, non-tender              Adnexa: no mass, fullness, tenderness               Rectovaginal: Confirms               Anus:  normal sphincter tone, no lesions  Chaperone was present for exam.  A:  Well Woman with normal exam  Oligomenorrhea  Fatigue  Weight gain  FH of DM  Elevated lipids  P:   No pap this year  Discussed breast self exam  Discussed calcium and vit D intake  Mammogram due, she will schedule  Cologuard  Return for fasting labs, will also check TSH, prolactin, FSH, HgbA1C

## 2019-01-20 NOTE — Patient Instructions (Signed)
EXERCISE AND DIET:  We recommended that you start or continue a regular exercise program for good health. Regular exercise means any activity that makes your heart beat faster and makes you sweat.  We recommend exercising at least 30 minutes per day at least 3 days a week, preferably 4 or 5.  We also recommend a diet low in fat and sugar.  Inactivity, poor dietary choices and obesity can cause diabetes, heart attack, stroke, and kidney damage, among others.    ALCOHOL AND SMOKING:  Women should limit their alcohol intake to no more than 7 drinks/beers/glasses of wine (combined, not each!) per week. Moderation of alcohol intake to this level decreases your risk of breast cancer and liver damage. And of course, no recreational drugs are part of a healthy lifestyle.  And absolutely no smoking or even second hand smoke. Most people know smoking can cause heart and lung diseases, but did you know it also contributes to weakening of your bones? Aging of your skin?  Yellowing of your teeth and nails?  CALCIUM AND VITAMIN D:  Adequate intake of calcium and Vitamin D are recommended.  The recommendations for exact amounts of these supplements seem to change often, but generally speaking 1,000 mg of calcium (between diet and supplement) and 800 units of Vitamin D per day seems prudent. Certain women may benefit from higher intake of Vitamin D.  If you are among these women, your doctor will have told you during your visit.    PAP SMEARS:  Pap smears, to check for cervical cancer or precancers,  have traditionally been done yearly, although recent scientific advances have shown that most women can have pap smears less often.  However, every woman still should have a physical exam from her gynecologist every year. It will include a breast check, inspection of the vulva and vagina to check for abnormal growths or skin changes, a visual exam of the cervix, and then an exam to evaluate the size and shape of the uterus and  ovaries.  And after 50 years of age, a rectal exam is indicated to check for rectal cancers. We will also provide age appropriate advice regarding health maintenance, like when you should have certain vaccines, screening for sexually transmitted diseases, bone density testing, colonoscopy, mammograms, etc.   MAMMOGRAMS:  All women over 40 years old should have a yearly mammogram. Many facilities now offer a "3D" mammogram, which may cost around $50 extra out of pocket. If possible,  we recommend you accept the option to have the 3D mammogram performed.  It both reduces the number of women who will be called back for extra views which then turn out to be normal, and it is better than the routine mammogram at detecting truly abnormal areas.    COLON CANCER SCREENING: Now recommend starting at age 45. At this time colonoscopy is not covered for routine screening until 50. There are take home tests that can be done between 45-49.   COLONOSCOPY:  Colonoscopy to screen for colon cancer is recommended for all women at age 50.  We know, you hate the idea of the prep.  We agree, BUT, having colon cancer and not knowing it is worse!!  Colon cancer so often starts as a polyp that can be seen and removed at colonscopy, which can quite literally save your life!  And if your first colonoscopy is normal and you have no family history of colon cancer, most women don't have to have it again for   10 years.  Once every ten years, you can do something that may end up saving your life, right?  We will be happy to help you get it scheduled when you are ready.  Be sure to check your insurance coverage so you understand how much it will cost.  It may be covered as a preventative service at no cost, but you should check your particular policy.      Breast Self-Awareness Breast self-awareness means being familiar with how your breasts look and feel. It involves checking your breasts regularly and reporting any changes to your  health care provider. Practicing breast self-awareness is important. A change in your breasts can be a sign of a serious medical problem. Being familiar with how your breasts look and feel allows you to find any problems early, when treatment is more likely to be successful. All women should practice breast self-awareness, including women who have had breast implants. How to do a breast self-exam One way to learn what is normal for your breasts and whether your breasts are changing is to do a breast self-exam. To do a breast self-exam: Look for Changes  1. Remove all the clothing above your waist. 2. Stand in front of a mirror in a room with good lighting. 3. Put your hands on your hips. 4. Push your hands firmly downward. 5. Compare your breasts in the mirror. Look for differences between them (asymmetry), such as: ? Differences in shape. ? Differences in size. ? Puckers, dips, and bumps in one breast and not the other. 6. Look at each breast for changes in your skin, such as: ? Redness. ? Scaly areas. 7. Look for changes in your nipples, such as: ? Discharge. ? Bleeding. ? Dimpling. ? Redness. ? A change in position. Feel for Changes Carefully feel your breasts for lumps and changes. It is best to do this while lying on your back on the floor and again while sitting or standing in the shower or tub with soapy water on your skin. Feel each breast in the following way:  Place the arm on the side of the breast you are examining above your head.  Feel your breast with the other hand.  Start in the nipple area and make  inch (2 cm) overlapping circles to feel your breast. Use the pads of your three middle fingers to do this. Apply light pressure, then medium pressure, then firm pressure. The light pressure will allow you to feel the tissue closest to the skin. The medium pressure will allow you to feel the tissue that is a little deeper. The firm pressure will allow you to feel the tissue  close to the ribs.  Continue the overlapping circles, moving downward over the breast until you feel your ribs below your breast.  Move one finger-width toward the center of the body. Continue to use the  inch (2 cm) overlapping circles to feel your breast as you move slowly up toward your collarbone.  Continue the up and down exam using all three pressures until you reach your armpit.  Write Down What You Find  Write down what is normal for each breast and any changes that you find. Keep a written record with breast changes or normal findings for each breast. By writing this information down, you do not need to depend only on memory for size, tenderness, or location. Write down where you are in your menstrual cycle, if you are still menstruating. If you are having trouble noticing differences   in your breasts, do not get discouraged. With time you will become more familiar with the variations in your breasts and more comfortable with the exam. How often should I examine my breasts? Examine your breasts every month. If you are breastfeeding, the best time to examine your breasts is after a feeding or after using a breast pump. If you menstruate, the best time to examine your breasts is 5-7 days after your period is over. During your period, your breasts are lumpier, and it may be more difficult to notice changes. When should I see my health care provider? See your health care provider if you notice:  A change in shape or size of your breasts or nipples.  A change in the skin of your breast or nipples, such as a reddened or scaly area.  Unusual discharge from your nipples.  A lump or thick area that was not there before.  Pain in your breasts.  Anything that concerns you.  

## 2019-01-23 ENCOUNTER — Other Ambulatory Visit: Payer: Self-pay

## 2019-01-27 ENCOUNTER — Other Ambulatory Visit: Payer: Self-pay

## 2019-01-27 ENCOUNTER — Other Ambulatory Visit (INDEPENDENT_AMBULATORY_CARE_PROVIDER_SITE_OTHER): Payer: Managed Care, Other (non HMO)

## 2019-01-27 DIAGNOSIS — Z0000 Encounter for general adult medical examination without abnormal findings: Secondary | ICD-10-CM

## 2019-01-27 DIAGNOSIS — N914 Secondary oligomenorrhea: Secondary | ICD-10-CM

## 2019-01-27 DIAGNOSIS — Z833 Family history of diabetes mellitus: Secondary | ICD-10-CM

## 2019-01-27 DIAGNOSIS — Z1211 Encounter for screening for malignant neoplasm of colon: Secondary | ICD-10-CM

## 2019-01-28 LAB — HEMOGLOBIN A1C
Est. average glucose Bld gHb Est-mCnc: 111 mg/dL
Hgb A1c MFr Bld: 5.5 % (ref 4.8–5.6)

## 2019-01-28 LAB — COMPREHENSIVE METABOLIC PANEL
ALT: 16 IU/L (ref 0–32)
AST: 17 IU/L (ref 0–40)
Albumin/Globulin Ratio: 2.1 (ref 1.2–2.2)
Albumin: 4.5 g/dL (ref 3.8–4.8)
Alkaline Phosphatase: 62 IU/L (ref 39–117)
BUN/Creatinine Ratio: 15 (ref 9–23)
BUN: 14 mg/dL (ref 6–24)
Bilirubin Total: 0.3 mg/dL (ref 0.0–1.2)
CO2: 26 mmol/L (ref 20–29)
Calcium: 9.8 mg/dL (ref 8.7–10.2)
Chloride: 100 mmol/L (ref 96–106)
Creatinine, Ser: 0.91 mg/dL (ref 0.57–1.00)
GFR calc Af Amer: 86 mL/min/{1.73_m2} (ref >59)
GFR calc non Af Amer: 74 mL/min/{1.73_m2} (ref >59)
Globulin, Total: 2.1 g/dL (ref 1.5–4.5)
Glucose: 101 mg/dL — ABNORMAL HIGH (ref 65–99)
Potassium: 4.8 mmol/L (ref 3.5–5.2)
Sodium: 142 mmol/L (ref 134–144)
Total Protein: 6.6 g/dL (ref 6.0–8.5)

## 2019-01-28 LAB — CBC
Hematocrit: 40.4 % (ref 34.0–46.6)
Hemoglobin: 13.6 g/dL (ref 11.1–15.9)
MCH: 32.2 pg (ref 26.6–33.0)
MCHC: 33.7 g/dL (ref 31.5–35.7)
MCV: 96 fL (ref 79–97)
Platelets: 287 10*3/uL (ref 150–450)
RBC: 4.22 x10E6/uL (ref 3.77–5.28)
RDW: 12.1 % (ref 11.7–15.4)
WBC: 5.8 10*3/uL (ref 3.4–10.8)

## 2019-01-28 LAB — LIPID PANEL
Chol/HDL Ratio: 4.1 ratio (ref 0.0–4.4)
Cholesterol, Total: 235 mg/dL — ABNORMAL HIGH (ref 100–199)
HDL: 58 mg/dL (ref >39)
LDL Calculated: 164 mg/dL — ABNORMAL HIGH (ref 0–99)
Triglycerides: 65 mg/dL (ref 0–149)
VLDL Cholesterol Cal: 13 mg/dL (ref 5–40)

## 2019-01-28 LAB — FOLLICLE STIMULATING HORMONE: FSH: 56.8 m[IU]/mL

## 2019-01-28 LAB — TSH: TSH: 2.29 u[IU]/mL (ref 0.450–4.500)

## 2019-01-28 LAB — PROLACTIN: Prolactin: 12.1 ng/mL (ref 4.8–23.3)

## 2019-03-31 ENCOUNTER — Other Ambulatory Visit: Payer: Self-pay | Admitting: Family Medicine

## 2019-03-31 DIAGNOSIS — Z1231 Encounter for screening mammogram for malignant neoplasm of breast: Secondary | ICD-10-CM

## 2019-04-14 ENCOUNTER — Telehealth: Payer: Self-pay | Admitting: Obstetrics and Gynecology

## 2019-04-14 NOTE — Telephone Encounter (Signed)
mychart message sent reminding her to do the cologuard.

## 2019-05-18 ENCOUNTER — Ambulatory Visit
Admission: RE | Admit: 2019-05-18 | Discharge: 2019-05-18 | Disposition: A | Discharge status: Home / Self Care | Payer: Managed Care, Other (non HMO) | Source: Ambulatory Visit | Attending: Obstetrics and Gynecology | Admitting: Obstetrics and Gynecology

## 2019-05-18 ENCOUNTER — Other Ambulatory Visit: Payer: Self-pay

## 2019-05-18 DIAGNOSIS — Z1231 Encounter for screening mammogram for malignant neoplasm of breast: Secondary | ICD-10-CM

## 2019-10-28 ENCOUNTER — Encounter: Payer: Self-pay | Admitting: Family Medicine

## 2019-10-28 ENCOUNTER — Telehealth: Payer: Self-pay

## 2019-10-28 ENCOUNTER — Ambulatory Visit (INDEPENDENT_AMBULATORY_CARE_PROVIDER_SITE_OTHER): Payer: Managed Care, Other (non HMO) | Admitting: Family Medicine

## 2019-10-28 VITALS — BP 135/72 | HR 78 | Temp 96.8°F | Ht 65.0 in | Wt 151.0 lb

## 2019-10-28 DIAGNOSIS — J0100 Acute maxillary sinusitis, unspecified: Secondary | ICD-10-CM | POA: Diagnosis not present

## 2019-10-28 MED ORDER — FLUTICASONE PROPIONATE 50 MCG/ACT NA SUSP: 2.0000 | Freq: Every day | NASAL | 6 refills | Status: AC

## 2019-10-28 NOTE — Telephone Encounter (Signed)
Per Dr Claiborne Billings pt needs to be virtual. Pt was scheduled for acute VV.

## 2019-10-28 NOTE — Patient Instructions (Signed)

## 2019-10-28 NOTE — Progress Notes (Signed)
VIRTUAL VISIT VIA VIDEO  I connected with Heather Shah on 10/28/19 at  1:15 PM EST by elemedicine application and verified that I am speaking with the correct person using two identifiers. Location patient: Home Location provider: [facility], Office Persons participating in the virtual visit: Patient, Dr. Raoul Pitch and R.Baker, LPN  I discussed the limitations of evaluation and management by telemedicine and the availability of in person appointments. The patient expressed understanding and agreed to proceed.   SUBJECTIVE Chief Complaint  Patient presents with  . Sore Throat    Pt has had neck and throat pain since Saturday. Does not hurt to swallow. Chills but no fever.     HPI: Heather Shah is a 49 y.o. female present for neck and throat pain 5 days duration.  She denies difficulty or painful swallowing.  She denies ear pain, decreased hearing, fever, nausea, vomit or cough.  She endorses right sinus pressure, occasional chills (which she states is consistent with her menopausal symptoms), right side of her neck is tender to palpate without masses.  She reports she has done some neck exercises over the last few weeks.  She has had some discomfort back of her neck as well.  She is taking Xyzal daily.  She has not been exposed to any known sick contacts.  She is eating and drinking well.  ROS: See pertinent positives and negatives per HPI.  Patient Active Problem List   Diagnosis Date Noted  . Hyperlipidemia   . Weight gain 11/01/2017  . Elevated hemoglobin A1c 10/01/2016  . Encounter for preventive health examination 10/01/2016    Social History   Tobacco Use  . Smoking status: Never Smoker  . Smokeless tobacco: Never Used  Substance Use Topics  . Alcohol use: Yes    Comment: rum every other day, small amount    Current Outpatient Medications:  .  levocetirizine (XYZAL) 5 MG tablet, Take 5 mg by mouth every evening., Disp: , Rfl:  .  Multiple Vitamin  (MULTIVITAMIN ADULT PO), Take by mouth., Disp: , Rfl:  .  Saccharomyces boulardii (PROBIOTIC) 250 MG CAPS, Take by mouth., Disp: , Rfl:  .  Vitamin D, Cholecalciferol, 1000 units CAPS, Take by mouth., Disp: , Rfl:  .  fluticasone (FLONASE) 50 MCG/ACT nasal spray, Place 2 sprays into both nostrils daily., Disp: 16 g, Rfl: 6  Allergies  Allergen Reactions  . Bactrim [Sulfamethoxazole-Trimethoprim] Anaphylaxis    hallucinating  . Penicillins     sneezing    OBJECTIVE: BP 135/72   Pulse 78   Temp (!) 96.8 F (36 C) (Oral)   Ht 5\' 5"  (1.651 m)   Wt 151 lb (68.5 kg)   BMI 25.13 kg/m  Gen: No acute distress. Nontoxic in appearance.  HENT: AT. [place].  MMM.  Eyes:Pupils Equal Round Reactive to light, Extraocular movements intact,  Conjunctiva without redness, discharge or icterus. Chest: Cough or shortness of breath not present Skin: No rashes, purpura or petechiae.  Neuro:  Normal gait. Alert. Oriented x3  Psych: Normal affect and demeanor. Normal speech. Normal thought content and judgment.  ASSESSMENT AND PLAN: Heather Shah is a 49 y.o. female present for  Acute non-recurrent maxillary sinusitis Rest.  Hydrate. Continue Xyzal Start Flonase daily Use nasal saline flush twice daily Avoid neck exercises until resolved. Advil for discomfort. If symptoms not improved or worsening after 7-10 days follow-up would consider antibiotic use at that time.  Patient is aware this is likely a viral  etiology and antibiotics would not be helpful at this time.   Felix Pacini, DO 10/28/2019    No orders of the defined types were placed in this encounter.  Meds ordered this encounter  Medications  . fluticasone (FLONASE) 50 MCG/ACT nasal spray    Sig: Place 2 sprays into both nostrils daily.    Dispense:  16 g    Refill:  6   Referral Orders  No referral(s) requested today

## 2019-10-28 NOTE — Telephone Encounter (Signed)
Patient is requesting same day appointment for R side pain in ear that runs down to her lymph nodes. She feels like she has a fever but when she takes it she isn't running one.

## 2019-10-28 NOTE — Telephone Encounter (Signed)
Pt was called and she is having R sided throat and ear pain, between ear and lymph node per pt. Throat does not hurt when she swallows but feels like there is something "in her neck". No fever but it is having chills off and on. Pt has mild  congestion and cough but states she always has this due to allergies and it is not any worse than it is every day.    Please advise if this can be virtual or if you would rather pt come to the office.

## 2020-05-09 IMAGING — MG DIGITAL SCREENING BILATERAL MAMMOGRAM WITH TOMO AND CAD
8 series · 9 of 24 positions shown · non-contrast
Comparison: Previous exam(s).

CLINICAL DATA: Screening.

EXAM:
DIGITAL SCREENING BILATERAL MAMMOGRAM WITH TOMO AND CAD

[L CC synth-2D]
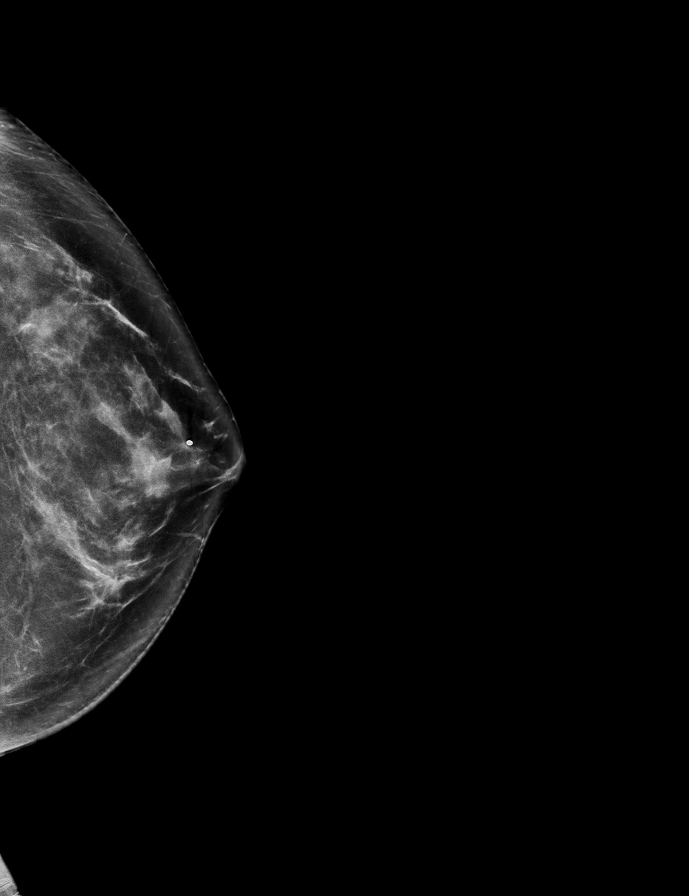

[R MLO synth-2D]
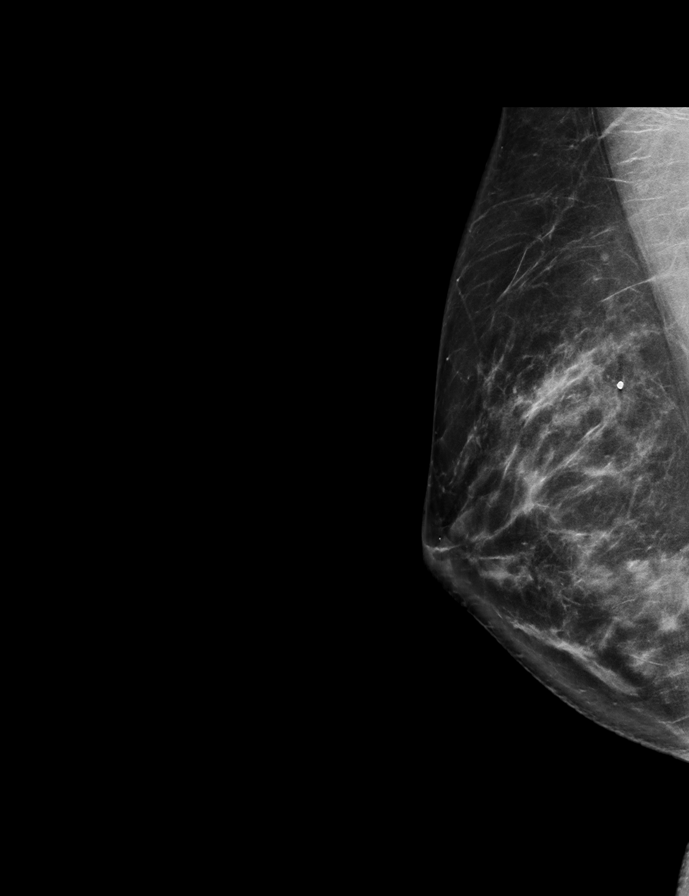

[R CC synth-2D]
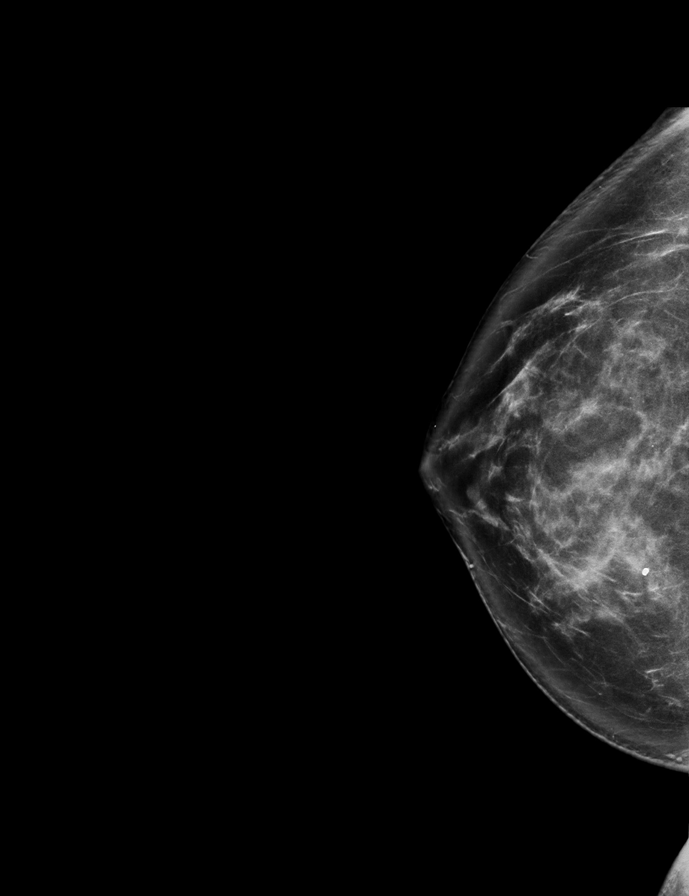

[L MLO synth-2D]
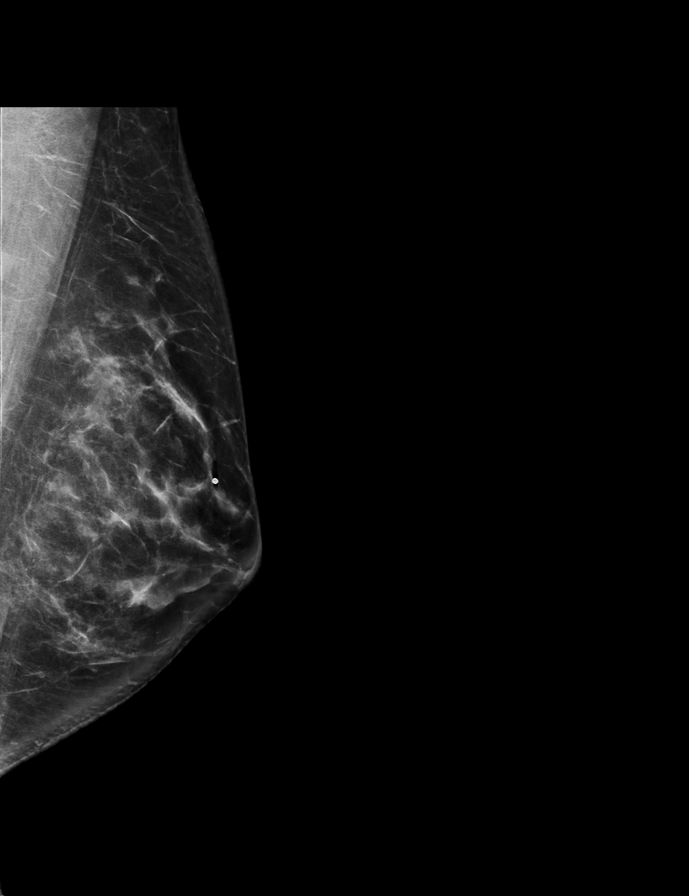

[L MLO tomo · 2 of 74 frames shown]
[frame 24/74]
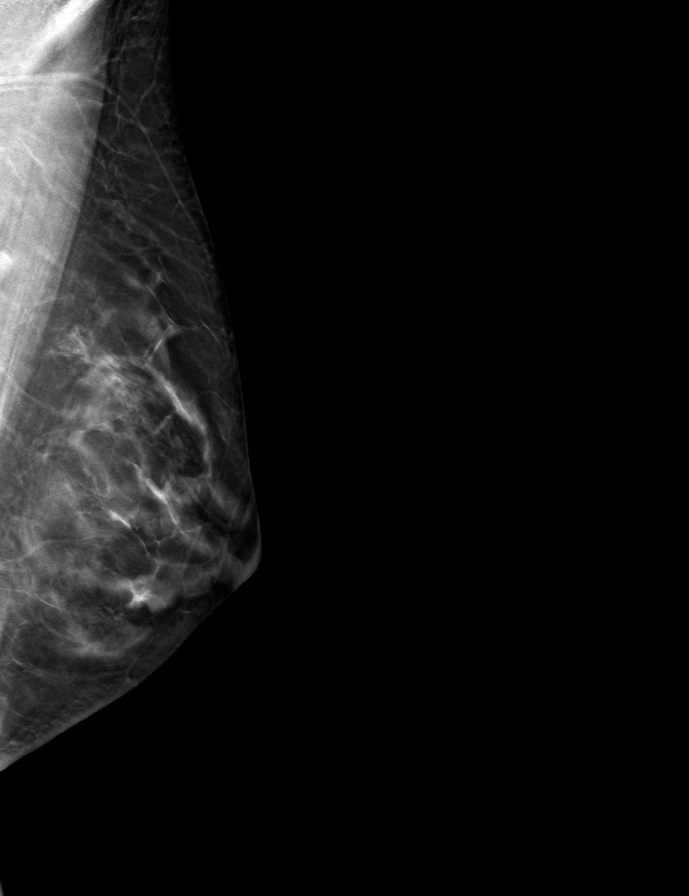
[frame 37/74]
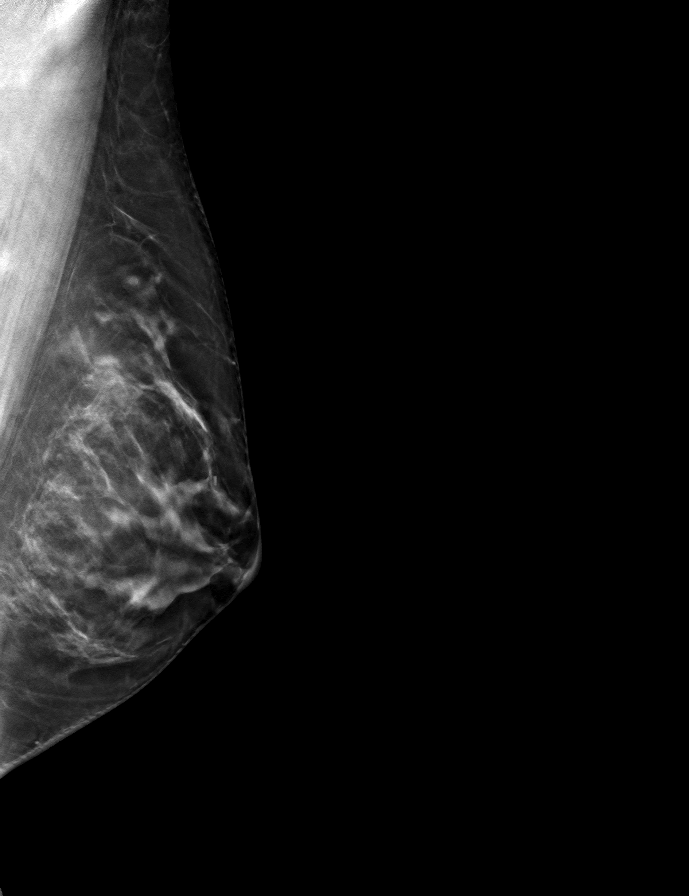

[L CC tomo · tomo slice 36/71.0]
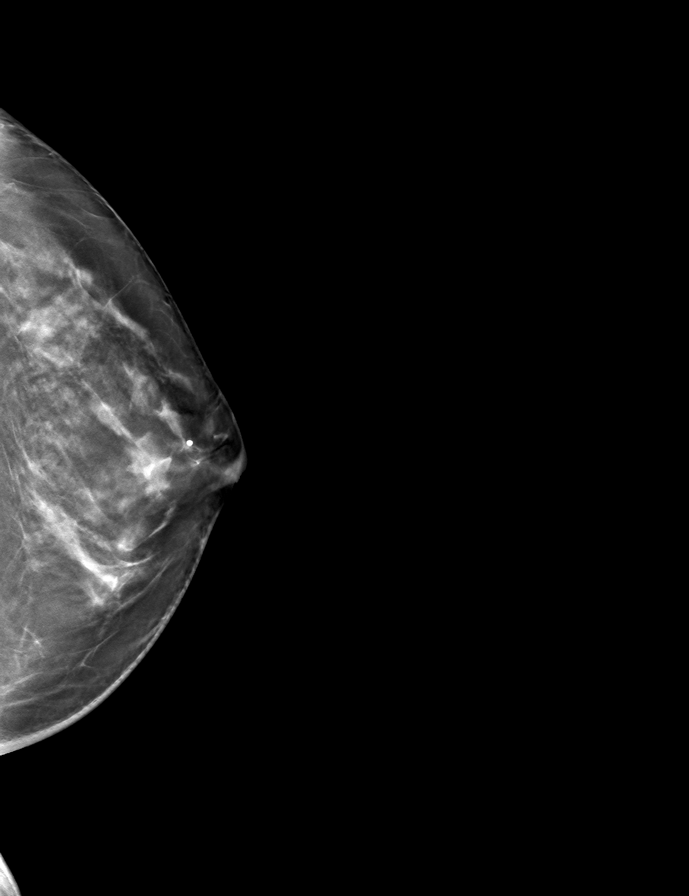

[R MLO tomo · tomo slice 39/76.0]
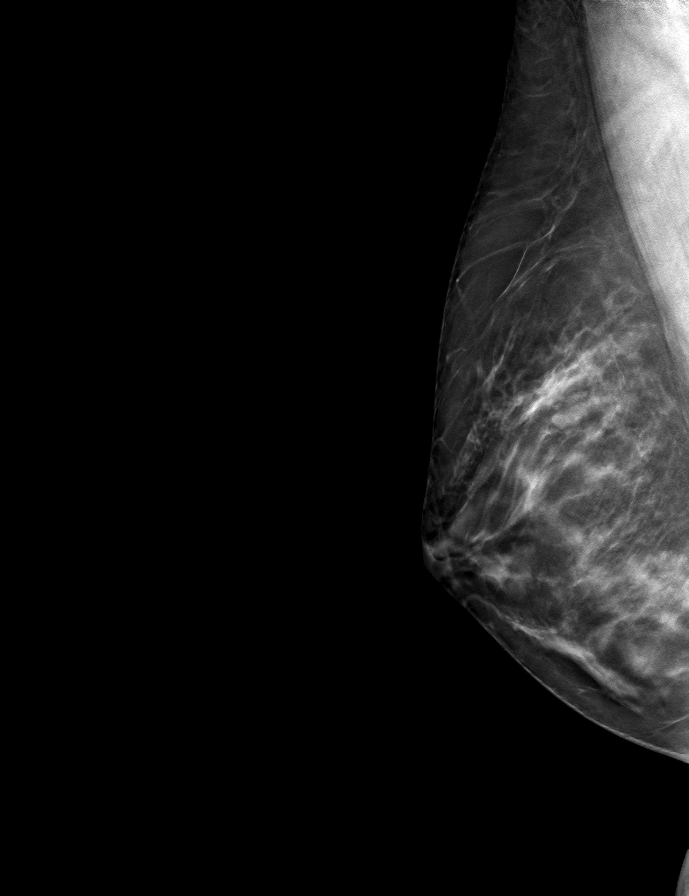

[R CC tomo · tomo slice 41/82.0]
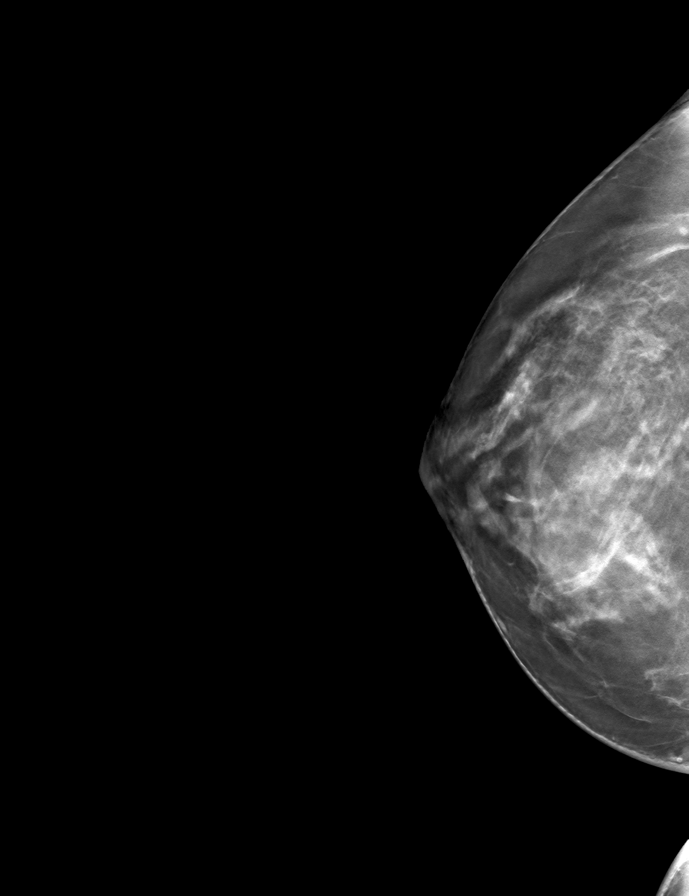

[9 of 24 positions shown; findings below may reference images not displayed]

ACR Breast Density Category c: The breast tissue is heterogeneously
dense, which may obscure small masses.
FINDINGS: There are no findings suspicious for malignancy. Images were
processed with CAD.
IMPRESSION: No mammographic evidence of malignancy. A result letter of this
screening mammogram will be mailed directly to the patient.

RECOMMENDATION:
Screening mammogram in one year. (Code:[id number]-U-LHB)

BI-RADS CATEGORY  1: Negative.

## 2020-10-05 ENCOUNTER — Ambulatory Visit: Payer: Managed Care, Other (non HMO) | Admitting: Obstetrics and Gynecology

## 2021-01-17 ENCOUNTER — Encounter: Payer: Managed Care, Other (non HMO) | Admitting: Family Medicine

## 2021-02-02 ENCOUNTER — Encounter: Payer: Self-pay | Admitting: Family Medicine

## 2021-02-02 ENCOUNTER — Ambulatory Visit (INDEPENDENT_AMBULATORY_CARE_PROVIDER_SITE_OTHER): Payer: Managed Care, Other (non HMO) | Admitting: Family Medicine

## 2021-02-02 ENCOUNTER — Other Ambulatory Visit: Payer: Self-pay

## 2021-02-02 VITALS — BP 120/80 | HR 59 | Temp 97.6°F | Ht 65.0 in | Wt 145.0 lb

## 2021-02-02 DIAGNOSIS — R7309 Other abnormal glucose: Secondary | ICD-10-CM | POA: Diagnosis not present

## 2021-02-02 DIAGNOSIS — Z1159 Encounter for screening for other viral diseases: Secondary | ICD-10-CM

## 2021-02-02 DIAGNOSIS — Z1211 Encounter for screening for malignant neoplasm of colon: Secondary | ICD-10-CM

## 2021-02-02 DIAGNOSIS — Z0000 Encounter for general adult medical examination without abnormal findings: Secondary | ICD-10-CM | POA: Diagnosis not present

## 2021-02-02 DIAGNOSIS — Z1231 Encounter for screening mammogram for malignant neoplasm of breast: Secondary | ICD-10-CM

## 2021-02-02 DIAGNOSIS — E782 Mixed hyperlipidemia: Secondary | ICD-10-CM

## 2021-02-02 LAB — CBC WITH DIFFERENTIAL/PLATELET
Basophils Absolute: 0 10*3/uL (ref 0.0–0.1)
Basophils Relative: 0.8 % (ref 0.0–3.0)
Eosinophils Absolute: 0.2 10*3/uL (ref 0.0–0.7)
Eosinophils Relative: 3.9 % (ref 0.0–5.0)
HCT: 42.9 % (ref 36.0–46.0)
Hemoglobin: 14.6 g/dL (ref 12.0–15.0)
Lymphocytes Relative: 26.3 % (ref 12.0–46.0)
Lymphs Abs: 1.4 10*3/uL (ref 0.7–4.0)
MCHC: 34.1 g/dL (ref 30.0–36.0)
MCV: 93.1 fl (ref 78.0–100.0)
Monocytes Absolute: 0.3 10*3/uL (ref 0.1–1.0)
Monocytes Relative: 6.6 % (ref 3.0–12.0)
Neutro Abs: 3.3 10*3/uL (ref 1.4–7.7)
Neutrophils Relative %: 62.4 % (ref 43.0–77.0)
Platelets: 297 10*3/uL (ref 150.0–400.0)
RBC: 4.61 Mil/uL (ref 3.87–5.11)
RDW: 13 % (ref 11.5–15.5)
WBC: 5.2 10*3/uL (ref 4.0–10.5)

## 2021-02-02 LAB — LIPID PANEL
Cholesterol: 252 mg/dL — ABNORMAL HIGH (ref 0–200)
HDL: 70.1 mg/dL (ref >39.00)
LDL Cholesterol: 166 mg/dL — ABNORMAL HIGH (ref 0–99)
NonHDL: 181.91
Total CHOL/HDL Ratio: 4
Triglycerides: 81 mg/dL (ref 0.0–149.0)
VLDL: 16.2 mg/dL (ref 0.0–40.0)

## 2021-02-02 LAB — COMPREHENSIVE METABOLIC PANEL
ALT: 15 U/L (ref 0–35)
AST: 17 U/L (ref 0–37)
Albumin: 4.6 g/dL (ref 3.5–5.2)
Alkaline Phosphatase: 60 U/L (ref 39–117)
BUN: 13 mg/dL (ref 6–23)
CO2: 29 mEq/L (ref 19–32)
Calcium: 9.9 mg/dL (ref 8.4–10.5)
Chloride: 100 mEq/L (ref 96–112)
Creatinine, Ser: 0.99 mg/dL (ref 0.40–1.20)
GFR: 66.04 mL/min (ref >60.00)
Glucose, Bld: 95 mg/dL (ref 70–99)
Potassium: 4.3 mEq/L (ref 3.5–5.1)
Sodium: 138 mEq/L (ref 135–145)
Total Bilirubin: 0.6 mg/dL (ref 0.2–1.2)
Total Protein: 7.3 g/dL (ref 6.0–8.3)

## 2021-02-02 LAB — TSH: TSH: 2.22 u[IU]/mL (ref 0.35–4.50)

## 2021-02-02 LAB — HEMOGLOBIN A1C: Hgb A1c MFr Bld: 5.5 % (ref 4.6–6.5)

## 2021-02-02 NOTE — Progress Notes (Signed)
This visit occurred during the SARS-CoV-2 public health emergency.  Safety protocols were in place, including screening questions prior to the visit, additional usage of staff PPE, and extensive cleaning of exam room while observing appropriate contact time as indicated for disinfecting solutions.    Patient ID: Heather Shah, female  DOB: 12-02-1970, 50 y.o.   MRN: [id number] Patient Care Team    Relationship Specialty Notifications Start End  Natalia Leatherwood, DO PCP - General Family Medicine  10/01/16   Ob/Gyn, Nestor Ramp    02/02/21     Chief Complaint  Patient presents with   Annual Exam    Pt is fasting    Subjective: Heather Shah is a 50 y.o.  Female  present for CPE. All past medical history, surgical history, allergies, family history, immunizations, medications and social history were updated in the electronic medical record today. All recent labs, ED visits and hospitalizations within the last year were reviewed.  Health maintenance:  Colonoscopy: No fhx, screen at 43. Elected cologuard.  Mammogram: 12/21/2016 birads 1. She has a Mat sunt with breast cancer in 65s. Mammogram ordered at breast center. Cervical cancer screening: last pap: 2019, Dr. Oscar La.  Immunizations: tdap 10/01/2016, Influenza declined (encouraged yearly), covid declined, shingrix declined Infectious disease screening: HIV completed; Hep C completed Assistive device: none Oxygen KDT:OIZT Patient has a Dental home. Hospitalizations/ED visits: reviewed   Depression screen [facility] 2/9 02/02/2021 11/01/2017 10/28/2017  Decreased Interest 0 0 0  Down, Depressed, Hopeless 0 0 0  PHQ - 2 Score 0 0 0   No flowsheet data found.   Immunization History  Administered Date(s) Administered   Tdap 10/01/2016   Past Medical History:  Diagnosis Date   Anemia    Anxiety    Elevated hemoglobin A1c    Heart murmur    Hyperlipidemia    Laryngopharyngeal reflux (LPR)    Allergies  Allergen Reactions    Bactrim [Sulfamethoxazole-Trimethoprim] Anaphylaxis    hallucinating   Penicillins     sneezing   Past Surgical History:  Procedure Laterality Date   BREAST BIOPSY  2004   CESAREAN SECTION  2007   DILATION AND EVACUATION  2010, 2012   x 2   HYSTEROSCOPY WITH D & C     Family History  Problem Relation Age of Onset   Diabetes Mother    Hypertension Mother    Heart disease Mother    Diabetes Father    Alcohol abuse Father    Hypertension Father    Hyperlipidemia Father    Heart disease Father    Breast cancer Maternal Aunt 33   Diabetes Paternal Aunt    Social History   Social History Narrative   Married to "[name]". One child.    BA, Designer, fashion/clothing.    Moved from [facility] 02/2016.    Drinks caffeine. Uses herbal remedies.    Wears her seatbelt. Smoke detector in the home.    Firearms locked in the home.    Feels safe in her realtionships.     Allergies as of 02/02/2021       Reactions   Bactrim [sulfamethoxazole-trimethoprim] Anaphylaxis   hallucinating   Penicillins    sneezing        Medication List        Accurate as of February 02, 2021 10:29 AM. If you have any questions, ask your nurse or doctor.          fluticasone 50 MCG/ACT nasal spray Commonly known  as: FLONASE Place 2 sprays into both nostrils daily.   levocetirizine 5 MG tablet Commonly known as: XYZAL Take 5 mg by mouth every evening.   MULTIVITAMIN ADULT PO Take by mouth.   Probiotic 250 MG Caps Take by mouth.   Vitamin D (Cholecalciferol) 25 MCG (1000 UT) Caps Take by mouth.        All past medical history, surgical history, allergies, family history, immunizations andmedications were updated in the EMR today and reviewed under the history and medication portions of their EMR.     No results found for this or any previous visit (from the past 2160 hour(s)).   ROS: 14 pt review of systems performed and negative (unless mentioned in an HPI)  Objective: BP 120/80   Pulse  (!) 59   Temp 97.6 F (36.4 C) (Oral)   Ht 5\' 5"  (1.651 m)   Wt 145 lb (65.8 kg)   SpO2 98%   BMI 24.13 kg/m  Gen: Afebrile. No acute distress. Nontoxic in appearance, well-developed, well-nourished,  pleasant female HENT: AT. [place]. Bilateral TM visualized and normal in appearance, normal external auditory canal. MMM, no oral lesions, adequate dentition. Bilateral nares within normal limits. Throat without erythema, ulcerations or exudates. no Cough on exam, no hoarseness on exam. Eyes:Pupils Equal Round Reactive to light, Extraocular movements intact,  Conjunctiva without redness, discharge or icterus. Neck/lymp/endocrine: Supple,no lymphadenopathy, no thyromegaly CV: RRR no murmur, no edema, +2/4 P posterior tibialis pulses.  Chest: CTAB, no wheeze, rhonchi or crackles. normal Respiratory effort. good Air movement. Abd: Soft. diastasis recti  NTND. BS present. no Masses palpated. No hepatosplenomegaly. No rebound tenderness or guarding. Skin: no rashes, purpura or petechiae. Warm and well-perfused. Skin intact. Neuro/Msk:  Normal gait. PERLA. EOMi. Alert. Oriented x3.  Cranial nerves II through XII intact. Muscle strength 5/5 upper/lower extremity. DTRs equal bilaterally. Psych: Normal affect, dress and demeanor. Normal speech. Normal thought content and judgment.   No results found.  Assessment/plan: Heather Shah is a 50 y.o. female present for CPE  Mixed hyperlipidemia Diet and exercise.  - CBC with Differential/Platelet - Comprehensive metabolic panel - Lipid panel - TSH Elevated hemoglobin A1c - Hemoglobin A1c Need for hepatitis C screening test - Hepatitis C antibody Breast cancer screening by mammogram Completed at gyn- requested records Colon cancer screening - Cologuard Encounter for preventive health examination Colonoscopy: No fhx, screen at 43. Elected cologuard.  Mammogram: 12/21/2016 birads 1. She has a Mat sunt with breast cancer in 64s. Mammogram  ordered at breast center. Cervical cancer screening: last pap: 2019, Dr. 2020.  Immunizations: tdap 10/01/2016, Influenza declined (encouraged yearly), covid declined, shingrix declined Infectious disease screening: HIV completed; Hep C completed today Patient was encouraged to exercise greater than 150 minutes a week. Patient was encouraged to choose a diet filled with fresh fruits and vegetables, and lean meats. AVS provided to patient today for education/recommendation on gender specific health and safety maintenance. Return in about 1 year (around 02/02/2022) for CPE (30 min).  Orders Placed This Encounter  Procedures   CBC with Differential/Platelet   Comprehensive metabolic panel   Hemoglobin A1c   Lipid panel   TSH   Hepatitis C antibody   Cologuard    No orders of the defined types were placed in this encounter.  Referral Orders  No referral(s) requested today     Electronically signed by 04/04/2022, DO [facility] [place]

## 2021-02-02 NOTE — Patient Instructions (Signed)
Health Maintenance, Female Adopting a healthy lifestyle and getting preventive care are important in promoting health and wellness. Ask your health care provider about:  The right schedule for you to have regular tests and exams.  Things you can do on your own to prevent diseases and keep yourself healthy. What should I know about diet, weight, and exercise? Eat a healthy diet  Eat a diet that includes plenty of vegetables, fruits, low-fat dairy products, and lean protein.  Do not eat a lot of foods that are high in solid fats, added sugars, or sodium.   Maintain a healthy weight Body mass index (BMI) is used to identify weight problems. It estimates body fat based on height and weight. Your health care provider can help determine your BMI and help you achieve or maintain a healthy weight. Get regular exercise Get regular exercise. This is one of the most important things you can do for your health. Most adults should:  Exercise for at least 150 minutes each week. The exercise should increase your heart rate and make you sweat (moderate-intensity exercise).  Do strengthening exercises at least twice a week. This is in addition to the moderate-intensity exercise.  Spend less time sitting. Even light physical activity can be beneficial. Watch cholesterol and blood lipids Have your blood tested for lipids and cholesterol at 52 years of age, then have this test every 5 years. Have your cholesterol levels checked more often if:  Your lipid or cholesterol levels are high.  You are older than 52 years of age.  You are at high risk for heart disease. What should I know about cancer screening? Depending on your health history and family history, you may need to have cancer screening at various ages. This may include screening for:  Breast cancer.  Cervical cancer.  Colorectal cancer.  Skin cancer.  Lung cancer. What should I know about heart disease, diabetes, and high blood  pressure? Blood pressure and heart disease  High blood pressure causes heart disease and increases the risk of stroke. This is more likely to develop in people who have high blood pressure readings, are of African descent, or are overweight.  Have your blood pressure checked: ? Every 3-5 years if you are 18-39 years of age. ? Every year if you are 40 years old or older. Diabetes Have regular diabetes screenings. This checks your fasting blood sugar level. Have the screening done:  Once every three years after age 40 if you are at a normal weight and have a low risk for diabetes.  More often and at a younger age if you are overweight or have a high risk for diabetes. What should I know about preventing infection? Hepatitis B If you have a higher risk for hepatitis B, you should be screened for this virus. Talk with your health care provider to find out if you are at risk for hepatitis B infection. Hepatitis C Testing is recommended for:  Everyone born from 1945 through 1965.  Anyone with known risk factors for hepatitis C. Sexually transmitted infections (STIs)  Get screened for STIs, including gonorrhea and chlamydia, if: ? You are sexually active and are younger than 52 years of age. ? You are older than 52 years of age and your health care provider tells you that you are at risk for this type of infection. ? Your sexual activity has changed since you were last screened, and you are at increased risk for chlamydia or gonorrhea. Ask your health care provider   if you are at risk.  Ask your health care provider about whether you are at high risk for HIV. Your health care provider may recommend a prescription medicine to help prevent HIV infection. If you choose to take medicine to prevent HIV, you should first get tested for HIV. You should then be tested every 3 months for as long as you are taking the medicine. Pregnancy  If you are about to stop having your period (premenopausal) and  you may become pregnant, seek counseling before you get pregnant.  Take 400 to 800 micrograms (mcg) of folic acid every day if you become pregnant.  Ask for birth control (contraception) if you want to prevent pregnancy. Osteoporosis and menopause Osteoporosis is a disease in which the bones lose minerals and strength with aging. This can result in bone fractures. If you are 65 years old or older, or if you are at risk for osteoporosis and fractures, ask your health care provider if you should:  Be screened for bone loss.  Take a calcium or vitamin D supplement to lower your risk of fractures.  Be given hormone replacement therapy (HRT) to treat symptoms of menopause. Follow these instructions at home: Lifestyle  Do not use any products that contain nicotine or tobacco, such as cigarettes, e-cigarettes, and chewing tobacco. If you need help quitting, ask your health care provider.  Do not use street drugs.  Do not share needles.  Ask your health care provider for help if you need support or information about quitting drugs. Alcohol use  Do not drink alcohol if: ? Your health care provider tells you not to drink. ? You are pregnant, may be pregnant, or are planning to become pregnant.  If you drink alcohol: ? Limit how much you use to 0-1 drink a day. ? Limit intake if you are breastfeeding.  Be aware of how much alcohol is in your drink. In the [place] one drink equals one 12 oz bottle of beer (355 mL), one 5 oz glass of wine (148 mL), or one 1 oz glass of hard liquor (44 mL). General instructions  Schedule regular health, dental, and eye exams.  Stay current with your vaccines.  Tell your health care provider if: ? You often feel depressed. ? You have ever been abused or do not feel safe at home. Summary  Adopting a healthy lifestyle and getting preventive care are important in promoting health and wellness.  Follow your health care provider's instructions about healthy  diet, exercising, and getting tested or screened for diseases.  Follow your health care provider's instructions on monitoring your cholesterol and blood pressure. This information is not intended to replace advice given to you by your health care provider. Make sure you discuss any questions you have with your health care provider. Document Revised: 08/06/2018 Document Reviewed: 08/06/2018 Elsevier Patient Education  2021 Elsevier Inc.  

## 2021-02-03 ENCOUNTER — Telehealth: Payer: Self-pay | Admitting: Family Medicine

## 2021-02-03 LAB — HEPATITIS C ANTIBODY
Hepatitis C Ab: NONREACTIVE
SIGNAL TO CUT-OFF: 0.03 (ref <1.00)

## 2021-02-03 NOTE — Telephone Encounter (Signed)
Please call patient Liver, kidney and thyroid function are normal Blood cell counts and electrolytes are normal Diabetes screening/A1c is normal  Cholesterol panel looks about the same as last year.  LDL is high again at 166.       -She does meet criteria to consider starting a statin medication to help lower her cholesterol and provide her cardiovascular protection since her LDL has been greater than 335 for a few years now and she has a family history of heart disease.   -She would like the extra cardiovascular protection and help in lowering her cholesterol by starting a statin, please advise and I will call this in for her.  If she does not like to start a statin, please schedule her for follow-up in 8-12 with provider for recheck.  Of course continuing to exercise greater than 150 minutes a week and eating a low saturated fat, Mediterranean diet is also very helpful in lowering cholesterol.

## 2021-02-03 NOTE — Telephone Encounter (Signed)
Spoke with pt regarding labs and instructions. Pt declined rx 

## 2021-02-03 NOTE — Telephone Encounter (Signed)
LVM for pt to CB regarding results.  

## 2021-06-19 LAB — RESULTS CONSOLE HPV: CHL HPV: NEGATIVE

## 2021-06-19 LAB — HM MAMMOGRAPHY

## 2022-07-05 LAB — RESULTS CONSOLE HPV: CHL HPV: NEGATIVE

## 2022-07-05 LAB — HM MAMMOGRAPHY

## 2022-07-05 LAB — HM PAP SMEAR

## 2022-07-06 ENCOUNTER — Ambulatory Visit: Payer: Self-pay

## 2022-07-06 NOTE — Progress Notes (Signed)
Encounter for record request only

## 2023-04-05 ENCOUNTER — Telehealth: Payer: Self-pay

## 2023-04-08 ENCOUNTER — Other Ambulatory Visit (HOSPITAL_COMMUNITY): Payer: Self-pay

## 2023-04-08 NOTE — Telephone Encounter (Signed)
Wrong patient. CMM key is for another patient.

## 2023-04-08 NOTE — Telephone Encounter (Signed)
A user error has taken place: charting done on wrong patient and has been corrected.

## 2023-07-22 LAB — HM MAMMOGRAPHY

## 2023-07-30 LAB — HM PAP SMEAR
HPV Aptima: NEGATIVE
Pap: NEGATIVE

## 2023-09-11 NOTE — Progress Notes (Signed)
Patient ID: Heather Shah, female  DOB: 08/25/1971, 53 y.o.   MRN: [id number] Patient Care Team    Relationship Specialty Notifications Start End  Natalia Leatherwood, DO PCP - General Family Medicine  10/01/16   Ob/Gyn, Nestor Ramp    02/02/21     Chief Complaint  Patient presents with   Annual Exam    Pt is fasting; mam already requested    Subjective: Heather Shah is a 53 y.o.  Female  present for CPE. All past medical history, surgical history, allergies, family history, immunizations, medications and social history were updated in the electronic medical record today. All recent labs, ED visits and hospitalizations within the last year were reviewed.  Health maintenance:  Colonoscopy: No fhx, Cologuard was ordered last visit and pt did not complete. She states she has a box at home that she will complete (from GYN) Mammogram: 07/05/2022- gyn ordered- records requested Cervical cancer screening: last pap: UTD 2023, Dr. Oscar La.  Immunizations: tdap 10/01/2016, Influenza-declined(encouraged yearly),  shingrix declined Infectious disease screening: HIV completed; Hep C completed Assistive device: none Oxygen FAO:[id number] Patient has a Dental home. Hospitalizations/ED visits: Reviewed      09/12/2023    8:18 AM 02/02/2021    9:55 AM 11/01/2017   10:02 AM 10/28/2017    9:21 AM  Depression screen PHQ 2/9  Decreased Interest 0 0 0 0  Down, Depressed, Hopeless 0 0 0 0  PHQ - 2 Score 0 0 0 0  Altered sleeping 3     Tired, decreased energy 2     Change in appetite 2     Feeling bad or failure about yourself  1     Trouble concentrating 1     Moving slowly or fidgety/restless 0     Suicidal thoughts 0     PHQ-9 Score 9     Difficult doing work/chores Not difficult at all          No data to display           Immunization History  Administered Date(s) Administered   Tdap 10/01/2016   Past Medical History:  Diagnosis Date   Anemia    Anxiety    Elevated hemoglobin  A1c    Heart murmur    Hyperlipidemia    Laryngopharyngeal reflux (LPR)    Allergies  Allergen Reactions   Bactrim [Sulfamethoxazole-Trimethoprim] Anaphylaxis    hallucinating   Pseudoephedrine Itching   Penicillins     sneezing   Past Surgical History:  Procedure Laterality Date   BREAST BIOPSY  2004   CESAREAN SECTION  2007   DILATION AND EVACUATION  2010, 2012   x 2   HYSTEROSCOPY WITH D & C     Family History  Problem Relation Age of Onset   Diabetes Mother    Hypertension Mother    Heart disease Mother    Diabetes Father    Alcohol abuse Father    Hypertension Father    Hyperlipidemia Father    Heart disease Father    Breast cancer Maternal Aunt 48   Diabetes Paternal Aunt    Social History   Social History Narrative   Married to "[name]". One child.    BA, Designer, fashion/clothing.    Moved from [facility] 02/2016.    Drinks caffeine. Uses herbal remedies.    Wears her seatbelt. Smoke detector in the home.    Firearms locked in the home.    Feels safe in her realtionships.  Allergies as of 09/12/2023       Reactions   Bactrim [sulfamethoxazole-trimethoprim] Anaphylaxis   hallucinating   Pseudoephedrine Itching   Penicillins    sneezing        Medication List        Accurate as of September 12, 2023  8:19 AM. If you have any questions, ask your nurse or doctor.          fluticasone 50 MCG/ACT nasal spray Commonly known as: FLONASE Place 2 sprays into both nostrils daily.   levocetirizine 5 MG tablet Commonly known as: XYZAL Take 5 mg by mouth every evening.   MULTIVITAMIN ADULT PO Take by mouth.   Probiotic 250 MG Caps Take by mouth.   Vitamin D (Cholecalciferol) 25 MCG (1000 UT) Caps Take by mouth.        All past medical history, surgical history, allergies, family history, immunizations andmedications were updated in the EMR today and reviewed under the history and medication portions of their EMR.     No results found for this  or any previous visit (from the past 2160 hours).   ROS: 14 pt review of systems performed and negative (unless mentioned in an HPI)  Objective: BP 122/82   Pulse 66   Temp 97.7 F (36.5 C)   Ht 5' 5.55" (1.665 m)   Wt 145 lb 9.6 oz (66 kg)   SpO2 98%   BMI 23.82 kg/m  Physical Exam Vitals and nursing note reviewed.  Constitutional:      General: She is not in acute distress.    Appearance: Normal appearance. She is not ill-appearing or toxic-appearing.  HENT:     Head: Normocephalic and atraumatic.     Right Ear: Tympanic membrane, ear canal and external ear normal. There is no impacted cerumen.     Left Ear: Tympanic membrane, ear canal and external ear normal. There is no impacted cerumen.     Nose: No congestion or rhinorrhea.     Mouth/Throat:     Mouth: Mucous membranes are moist.     Pharynx: Oropharynx is clear. No oropharyngeal exudate or posterior oropharyngeal erythema.  Eyes:     General: No scleral icterus.       Right eye: No discharge.        Left eye: No discharge.     Extraocular Movements: Extraocular movements intact.     Conjunctiva/sclera: Conjunctivae normal.     Pupils: Pupils are equal, round, and reactive to light.  Cardiovascular:     Rate and Rhythm: Normal rate and regular rhythm.     Pulses: Normal pulses.     Heart sounds: Normal heart sounds. No murmur heard.    No friction rub. No gallop.  Pulmonary:     Effort: Pulmonary effort is normal. No respiratory distress.     Breath sounds: Normal breath sounds. No stridor. No wheezing, rhonchi or rales.  Chest:     Chest wall: No tenderness.  Abdominal:     General: Abdomen is flat. Bowel sounds are normal. There is no distension.     Palpations: Abdomen is soft. There is no mass.     Tenderness: There is no abdominal tenderness. There is no right CVA tenderness, left CVA tenderness, guarding or rebound.     Hernia: No hernia is present.  Musculoskeletal:        General: No swelling,  tenderness or deformity. Normal range of motion.     Cervical back: Normal range of motion and  neck supple. No rigidity or tenderness.     Right lower leg: No edema.     Left lower leg: No edema.  Lymphadenopathy:     Cervical: No cervical adenopathy.  Skin:    General: Skin is warm and dry.     Coloration: Skin is not jaundiced or pale.     Findings: No bruising, erythema, lesion or rash.  Neurological:     General: No focal deficit present.     Mental Status: She is alert and oriented to person, place, and time. Mental status is at baseline.     Cranial Nerves: No cranial nerve deficit.     Sensory: No sensory deficit.     Motor: No weakness.     Coordination: Coordination normal.     Gait: Gait normal.     Deep Tendon Reflexes: Reflexes normal.  Psychiatric:        Mood and Affect: Mood normal.        Behavior: Behavior normal.        Thought Content: Thought content normal.        Judgment: Judgment normal.     No results found.  Assessment/plan: Ashiana Starlin is a 53 y.o. female present for CPE  Mixed hyperlipidemia Diet and exercise.  Lipids collected  Encounter for preventive health examination Patient was encouraged to exercise greater than 150 minutes a week. Patient was encouraged to choose a diet filled with fresh fruits and vegetables, and lean meats. AVS provided to patient today for education/recommendation on gender specific health and safety maintenance. Colonoscopy: No fhx, Cologuard was ordered last visit and pt did not complete. She states she has a box at home that she will complete (from GYN) Mammogram: 07/05/2022- gyn ordered- records requested Cervical cancer screening: last pap: UTD 2023, Dr. Oscar La.  Immunizations: tdap 10/01/2016, Influenza-declined(encouraged yearly),  shingrix declined Infectious disease screening: HIV completed; Hep C completed  Return in about 1 year (around 09/12/2024) for cpe (20 min).  Orders Placed This Encounter   Procedures   CBC   Comp Met (CMET)   TSH   Hemoglobin A1c   Lipid panel    No orders of the defined types were placed in this encounter.   Referral Orders  No referral(s) requested today     Electronically signed by Felix Pacini, DO  [facility] [place]

## 2023-09-12 ENCOUNTER — Encounter: Payer: Self-pay | Admitting: Family Medicine

## 2023-09-12 ENCOUNTER — Ambulatory Visit (INDEPENDENT_AMBULATORY_CARE_PROVIDER_SITE_OTHER): Payer: Self-pay | Admitting: Family Medicine

## 2023-09-12 VITALS — BP 122/82 | HR 66 | Temp 97.7°F | Ht 65.55 in | Wt 145.6 lb

## 2023-09-12 DIAGNOSIS — E782 Mixed hyperlipidemia: Secondary | ICD-10-CM | POA: Diagnosis not present

## 2023-09-12 DIAGNOSIS — Z23 Encounter for immunization: Secondary | ICD-10-CM | POA: Diagnosis not present

## 2023-09-12 DIAGNOSIS — R7309 Other abnormal glucose: Secondary | ICD-10-CM | POA: Diagnosis not present

## 2023-09-12 DIAGNOSIS — Z0000 Encounter for general adult medical examination without abnormal findings: Secondary | ICD-10-CM

## 2023-09-12 DIAGNOSIS — Z1211 Encounter for screening for malignant neoplasm of colon: Secondary | ICD-10-CM

## 2023-09-12 LAB — CBC
HCT: 44.5 % (ref 36.0–46.0)
Hemoglobin: 15.1 g/dL — ABNORMAL HIGH (ref 12.0–15.0)
MCHC: 33.9 g/dL (ref 30.0–36.0)
MCV: 93.6 fL (ref 78.0–100.0)
Platelets: 289 10*3/uL (ref 150.0–400.0)
RBC: 4.75 Mil/uL (ref 3.87–5.11)
RDW: 12.6 % (ref 11.5–15.5)
WBC: 5.5 10*3/uL (ref 4.0–10.5)

## 2023-09-12 LAB — LIPID PANEL
Cholesterol: 248 mg/dL — ABNORMAL HIGH (ref 0–200)
HDL: 68.8 mg/dL (ref >39.00)
LDL Cholesterol: 166 mg/dL — ABNORMAL HIGH (ref 0–99)
NonHDL: 179.53
Total CHOL/HDL Ratio: 4
Triglycerides: 66 mg/dL (ref 0.0–149.0)
VLDL: 13.2 mg/dL (ref 0.0–40.0)

## 2023-09-12 LAB — TSH: TSH: 2.9 u[IU]/mL (ref 0.35–5.50)

## 2023-09-12 LAB — COMPREHENSIVE METABOLIC PANEL
ALT: 19 U/L (ref 0–35)
AST: 17 U/L (ref 0–37)
Albumin: 4.6 g/dL (ref 3.5–5.2)
Alkaline Phosphatase: 69 U/L (ref 39–117)
BUN: 14 mg/dL (ref 6–23)
CO2: 31 meq/L (ref 19–32)
Calcium: 9.7 mg/dL (ref 8.4–10.5)
Chloride: 102 meq/L (ref 96–112)
Creatinine, Ser: 0.91 mg/dL (ref 0.40–1.20)
GFR: 71.74 mL/min (ref >60.00)
Glucose, Bld: 105 mg/dL — ABNORMAL HIGH (ref 70–99)
Potassium: 5.1 meq/L (ref 3.5–5.1)
Sodium: 140 meq/L (ref 135–145)
Total Bilirubin: 0.4 mg/dL (ref 0.2–1.2)
Total Protein: 7.1 g/dL (ref 6.0–8.3)

## 2023-09-12 LAB — HEMOGLOBIN A1C: Hgb A1c MFr Bld: 5.7 % (ref 4.6–6.5)

## 2023-09-12 NOTE — Patient Instructions (Addendum)
Return in about 1 year (around 09/12/2024) for cpe (20 min).        Great to see you today.  I have refilled the medication(s) we provide.   If labs were collected or images ordered, we will inform you of  results once we have received them and reviewed. We will contact you either by echart message, or telephone call.  Please give ample time to the testing facility, and our office to run,  receive and review results. Please do not call inquiring of results, even if you can see them in your chart. We will contact you as soon as we are able. If it has been over 1 week since the test was completed, and you have not yet heard from [place], then please call us.    - echart message- for normal results that have been seen by the patient already.   - telephone call: abnormal results or if patient has not viewed results in their echart.  If a referral to a specialist was entered for you, please call us in 2 weeks if you have not heard from the specialist office to schedule.

## 2023-09-13 ENCOUNTER — Encounter: Payer: Self-pay | Admitting: Family Medicine

## 2023-09-16 ENCOUNTER — Ambulatory Visit: Payer: Managed Care, Other (non HMO) | Admitting: Family Medicine

## 2023-09-17 ENCOUNTER — Ambulatory Visit: Payer: Managed Care, Other (non HMO) | Admitting: Family Medicine

## 2023-09-17 ENCOUNTER — Encounter: Payer: Self-pay | Admitting: Family Medicine

## 2023-09-17 VITALS — BP 102/62 | HR 72 | Temp 97.6°F | Wt 149.0 lb

## 2023-09-17 DIAGNOSIS — R350 Frequency of micturition: Secondary | ICD-10-CM

## 2023-09-17 LAB — POC URINALSYSI DIPSTICK (AUTOMATED)
Bilirubin, UA: NEGATIVE
Blood, UA: NEGATIVE
Glucose, UA: NEGATIVE
Ketones, UA: NEGATIVE
Leukocytes, UA: NEGATIVE
Nitrite, UA: NEGATIVE
Protein, UA: NEGATIVE
Spec Grav, UA: 1.01 (ref 1.010–1.025)
Urobilinogen, UA: 0.2 U/dL
pH, UA: 6.5 (ref 5.0–8.0)

## 2023-09-17 NOTE — Progress Notes (Signed)
Heather Shah , 03-Jul-1971, 53 y.o., female MRN: [id number] Patient Care Team    Relationship Specialty Notifications Start End  Natalia Leatherwood, DO PCP - General Family Medicine  10/01/16   Ob/Gyn, Nestor Ramp    02/02/21     Chief Complaint  Patient presents with   Back Pain    1 month. Urinary frequency     Subjective: Heather Shah is a 53 y.o. Pt presents for an OV with complaints of lower back and abdomen pain of 1 month duration.  Associated symptoms include increased frequency of urination without dysuria or increased output. She had been at [facility] when she noticed the symptoms begin.  She is postmenopausal.  She denies fever or chills.  Bowel movements have regulated now that she is back home.       09/12/2023    8:18 AM 02/02/2021    9:55 AM 11/01/2017   10:02 AM 10/28/2017    9:21 AM  Depression screen PHQ 2/9  Decreased Interest 0 0 0 0  Down, Depressed, Hopeless 0 0 0 0  PHQ - 2 Score 0 0 0 0  Altered sleeping 3     Tired, decreased energy 2     Change in appetite 2     Feeling bad or failure about yourself  1     Trouble concentrating 1     Moving slowly or fidgety/restless 0     Suicidal thoughts 0     PHQ-9 Score 9     Difficult doing work/chores Not difficult at all       Allergies  Allergen Reactions   Bactrim [Sulfamethoxazole-Trimethoprim] Anaphylaxis    hallucinating   Pseudoephedrine Itching   Penicillins     sneezing   Social History   Social History Narrative   Married to "[name]". One child.    BA, Designer, fashion/clothing.    Moved from [facility] 02/2016.    Drinks caffeine. Uses herbal remedies.    Wears her seatbelt. Smoke detector in the home.    Firearms locked in the home.    Feels safe in her realtionships.    Past Medical History:  Diagnosis Date   Anemia    Anxiety    Elevated hemoglobin A1c    Heart murmur    Hyperlipidemia    Laryngopharyngeal reflux (LPR)    Past Surgical History:  Procedure Laterality Date    BREAST BIOPSY  2004   CESAREAN SECTION  2007   DILATION AND EVACUATION  2010, 2012   x 2   HYSTEROSCOPY WITH D & C     Family History  Problem Relation Age of Onset   Diabetes Mother    Hypertension Mother    Heart disease Mother    Diabetes Father    Alcohol abuse Father    Hypertension Father    Hyperlipidemia Father    Heart disease Father    Breast cancer Maternal Aunt 3   Diabetes Paternal Aunt    Allergies as of 09/17/2023       Reactions   Bactrim [sulfamethoxazole-trimethoprim] Anaphylaxis   hallucinating   Pseudoephedrine Itching   Penicillins    sneezing        Medication List        Accurate as of September 17, 2023 10:44 AM. If you have any questions, ask your nurse or doctor.          fluticasone 50 MCG/ACT nasal spray Commonly known as: FLONASE Place  2 sprays into both nostrils daily.   MULTIVITAMIN ADULT PO Take by mouth.   Probiotic 250 MG Caps Take by mouth.   Vitamin D (Cholecalciferol) 25 MCG (1000 UT) Caps Take by mouth.        All past medical history, surgical history, allergies, family history, immunizations andmedications were updated in the EMR today and reviewed under the history and medication portions of their EMR.     ROS Negative, with the exception of above mentioned in HPI   Objective:  BP 102/62   Pulse 72   Temp 97.6 F (36.4 C)   Wt 149 lb (67.6 kg)   SpO2 98%   BMI 24.38 kg/m  Body mass index is 24.38 kg/m. Physical Exam Vitals and nursing note reviewed.  Constitutional:      General: She is not in acute distress.    Appearance: Normal appearance. She is normal weight. She is not ill-appearing or toxic-appearing.  HENT:     Head: Normocephalic and atraumatic.  Eyes:     General: No scleral icterus.       Right eye: No discharge.        Left eye: No discharge.     Extraocular Movements: Extraocular movements intact.     Conjunctiva/sclera: Conjunctivae normal.     Pupils: Pupils are equal,  round, and reactive to light.  Abdominal:     General: Abdomen is flat. There is no distension.     Palpations: Abdomen is soft. There is no mass.     Tenderness: There is no abdominal tenderness. There is no right CVA tenderness, left CVA tenderness, guarding or rebound.     Hernia: No hernia is present.  Skin:    Findings: No rash.  Neurological:     Mental Status: She is alert and oriented to person, place, and time. Mental status is at baseline.     Motor: No weakness.     Coordination: Coordination normal.     Gait: Gait normal.  Psychiatric:        Mood and Affect: Mood normal.        Behavior: Behavior normal.        Thought Content: Thought content normal.        Judgment: Judgment normal.      No results found. No results found. Results for orders placed or performed in visit on 09/17/23 (from the past 24 hours)  POCT Urinalysis Dipstick (Automated)     Status: None   Collection Time: 09/17/23 10:43 AM  Result Value Ref Range   Color, UA yellow    Clarity, UA clear    Glucose, UA Negative Negative   Bilirubin, UA neg    Ketones, UA neg    Spec Grav, UA 1.010 1.010 - 1.025   Blood, UA neg    pH, UA 6.5 5.0 - 8.0   Protein, UA Negative Negative   Urobilinogen, UA 0.2 0.2 or 1.0 E.U./dL   Nitrite, UA neg    Leukocytes, UA Negative Negative    Assessment/Plan: Heather Shah is a 53 y.o. female present for OV for  Urinary frequency (Primary) - POCT Urinalysis Dipstick (Automated)> appears normal today - Urinalysis w microscopic + reflex cultur> sent Symptoms were possibly related to decrease in BM, gyn exam a few days prior, or bladder irritation . We will send cx to be complete and if infection present will treat at that time.    Reviewed expectations re: course of current medical issues. Discussed self-management  of symptoms. Outlined signs and symptoms indicating need for more acute intervention. Patient verbalized understanding and all questions were  answered. Patient received an After-Visit Summary.    Orders Placed This Encounter  Procedures   Urinalysis w microscopic + reflex cultur   POCT Urinalysis Dipstick (Automated)   No orders of the defined types were placed in this encounter.  Referral Orders  No referral(s) requested today     Note is dictated utilizing voice recognition software. Although note has been proof read prior to signing, occasional typographical errors still can be missed. If any questions arise, please do not hesitate to call for verification.   electronically signed by:  Felix Pacini, DO  [facility] [place]

## 2023-09-17 NOTE — Patient Instructions (Addendum)

## 2023-09-18 ENCOUNTER — Encounter: Payer: Self-pay | Admitting: Family Medicine

## 2023-09-18 LAB — URINALYSIS W MICROSCOPIC + REFLEX CULTURE
Bacteria, UA: NONE SEEN /[HPF]
Bilirubin Urine: NEGATIVE
Glucose, UA: NEGATIVE
Hgb urine dipstick: NEGATIVE
Hyaline Cast: NONE SEEN /[LPF]
Ketones, ur: NEGATIVE
Leukocyte Esterase: NEGATIVE
Nitrites, Initial: NEGATIVE
Protein, ur: NEGATIVE
RBC / HPF: NONE SEEN /[HPF] (ref 0–2)
Specific Gravity, Urine: 1.005 (ref 1.001–1.035)
Squamous Epithelial / HPF: NONE SEEN /[HPF] (ref <=5)
WBC, UA: NONE SEEN /[HPF] (ref 0–5)
pH: 7 (ref 5.0–8.0)

## 2023-09-18 LAB — NO CULTURE INDICATED

## 2024-09-14 ENCOUNTER — Encounter: Payer: Managed Care, Other (non HMO) | Admitting: Family Medicine
